# Patient Record
Sex: Female | Born: 1985 | Race: Black or African American | Hispanic: No | Smoking: Never smoker
Health system: Southern US, Community
[De-identification: ages and names within clinical notes are randomized; demographics above are authoritative.]

## PROBLEM LIST (undated history)

## (undated) ENCOUNTER — Inpatient Hospital Stay (HOSPITAL_COMMUNITY): Payer: Self-pay

## (undated) DIAGNOSIS — F32A Depression, unspecified: Secondary | ICD-10-CM

## (undated) DIAGNOSIS — Z8632 Personal history of gestational diabetes: Secondary | ICD-10-CM

## (undated) HISTORY — DX: Depression, unspecified: F32.A

## (undated) HISTORY — DX: Personal history of gestational diabetes: Z86.32

---

## 2019-11-23 ENCOUNTER — Ambulatory Visit (INDEPENDENT_AMBULATORY_CARE_PROVIDER_SITE_OTHER): Payer: Self-pay

## 2019-11-23 ENCOUNTER — Other Ambulatory Visit: Payer: Self-pay

## 2019-11-23 ENCOUNTER — Other Ambulatory Visit (HOSPITAL_COMMUNITY)
Admission: RE | Admit: 2019-11-23 | Discharge: 2019-11-23 | Disposition: A | Discharge status: Home / Self Care | Payer: Self-pay | Source: Ambulatory Visit

## 2019-11-23 DIAGNOSIS — Z3490 Encounter for supervision of normal pregnancy, unspecified, unspecified trimester: Secondary | ICD-10-CM | POA: Insufficient documentation

## 2019-11-23 DIAGNOSIS — Z3492 Encounter for supervision of normal pregnancy, unspecified, second trimester: Secondary | ICD-10-CM | POA: Insufficient documentation

## 2019-11-23 HISTORY — DX: Encounter for supervision of normal pregnancy, unspecified, unspecified trimester: Z34.90

## 2019-11-23 NOTE — Patient Instructions (Signed)
AREA PEDIATRIC/FAMILY PRACTICE PHYSICIANS  [place] ([address]) . [facility] o Chambliss, MD; Eniola, MD; Hale, MD; Hensel, MD; McDiarmid, MD; McIntyer, MD; Neal, MD; Walden, MD o [address] [place], [place] [address] o [phone] o Mon-Fri 8:30-12:30, 1:30-5:00 o Providers come to see babies at [facility] o Accepting Medicaid . [facility] o Limited providers who accept newborns: Koirala, MD; Morrow, MD; Wolters, MD o [address], [place], [place] [address] o [phone] o Mon-Fri 8:00-5:30 o Babies seen by providers at [facility] o Does NOT accept Medicaid o Please call early in hospitalization for appointment (limited availability)  . [facility] o Mulberry, MD o [address] [place], [place] [address] o [phone] o Mon, Tue, Thur, Fri 8:30-5:00, Wed 10:00-7:00 (closed 1-2pm) o Babies seen by [facility] providers o Accepting Medicaid . [facility] - Pediatrician o Rubin, MD o [address], [place], [place] [address] o [phone] o Mon-Fri 8:30-5:00, Sat 8:30-12:00 o Provider comes to see babies at [facility] o Accepting Medicaid o Must have been referred from current patients or contacted office prior to delivery . [facility] ([facility]) o Brown, MD; Chandler, MD; Ettefagh, MD; Grant, MD; Lester, MD; McCormick, MD; McQueen, MD; Prose, MD; Simha, MD; Stanley, MD; Stryffeler, NP; Tebben, NP o [address], [place], [place] [address] o [phone] o Mon, Tue, Thur, Fri 8:30-5:30, Wed 9:30-5:30, Sat 8:30-12:30 o Babies seen by [facility] providers o Accepting Medicaid o Only accepting infants of first-time parents or siblings of current patients o Hospital discharge coordinator will make follow-up appointment . Jack Amos o [address],  [place], [place]  [address] o [phone]   Fax - [phone] . [facility] o [address], [place], [place]  [address] o Phone - [phone]   Fax - [phone] . Shilpa Gosrani o [address], [place], [place]  [address] o [phone]  [place] ([address]) . [facility] o Bates, MD; Brassfield, MD; Cooper, Cox, MD; MD; Davis, MD; Dovico, MD; Ettefaugh, MD; Little, MD; Lowe, MD; Keiffer, MD; Melvin, MD; Sumner, MD; Williams, MD o [address], [place], [place] [address] o [phone] o Mon-Fri 8:30-5:00 (extended evenings Mon-Thur as needed), Sat-Sun 10:00-1:00 o Providers come to see babies at [facility] o Accepting Medicaid for families of first-time babies and families with all children in the household age 3 and under. Must register with office prior to making appointment (M-F only). . [facility] o Henson, NP; Knapp, MD; Lalonde, MD; Tysinger, PA o [address] [place], [place] [address] o [phone] o Mon-Fri 8:00-5:00 o Babies seen by providers at [facility] o Does NOT accept Medicaid/Commercial Insurance Only . [facility] ([facility])  o Artis, MD; Barnes, MD; Bratton, MD; Coccaro, MD; Lockett Gardner, MD; Kramer, MD; Marshall, MD; Netherton, MD; Poleto, MD; Skinner, MD o [address] [place], [place] [address] o [phone] o Mon-Fri 8:30-5:30, Sat (Oct.-Mar.) 9:00-1:00 o Babies seen by providers at [facility] o Accepting Medicaid  [place] ([address]) . [facility] o Reid, MD; Warner, MD o [address], [place], [place] [address] o [phone] o Mon-Fri 8:30-5:00, Sat 8:30-12:00 o Providers come to see babies at [facility] o Does NOT accept Medicaid . [facility] o Becker, PA; Hagler, MD; Scifres, PA; Sun, MD; Swayne, MD o [address],  [place], [place] [address] o [phone] o Mon-Fri 8:00-5:00 o Babies seen by providers at [facility] o Does NOT accept Medicaid o Only accepting babies of parents who are patients o Please call early in hospitalization for appointment (limited availability) . [facility] o Clark, MD; Frye, MD; Kelleher, MD; Mack, NP; Miller, MD; O'Keller, MD; Patterson, NP; Pudlo, MD; Puzio, MD; Thomas, MD; Tucker, MD; Twiselton, MD o [address], [place], [place] [address] o [phone] o Mon-Fri 8:00-5:00, Sat 9:00-12:00 o Providers come to see babies at [facility] o Does NOT accept Medicaid  [place] ([address]) . [facility] o Limited providers accepting new patients: Brake, NP; Wharton, PA o [address], [place], [place] [address] o [phone] o Mon-Fri 8:00-5:00 o Babies seen by providers at [facility] o Does NOT accept Medicaid o Only accepting babies of parents who are patients o Please call early in hospitalization for appointment (limited availability) . [facility] o Gay, MD; Quinlan, MD o [address] [place], [place] [address] o [phone] (press [phone] to schedule appointment) o Mon-Fri 8:00-5:00 o Providers come to see babies at [facility] o Does NOT accept Medicaid . [facility] o Mazer, MD o [address] [place], [place] [address] o [phone] o Mon-Fri 8:30-5:00 (lunch 12:30-1:00), extended hours by appointment only Wed 5:00-6:30 o Babies seen by [facility] providers o Accepting Medicaid . [facility] o Banks, MD; Jordan, MD; Koberlein, MD o [address], [place], [place] [address] o [phone] o Mon-Fri 8:00-5:00 o Babies seen by [facility] providers o Does NOT accept Medicaid . [facility] o Parker, MD; [place], MD; Wallace, DO o [address] [place], [place]  [address] o [phone] o Mon-Fri 8:00-5:00 o Babies seen by [facility] providers o Does NOT accept Medicaid . [facility] o Brandon, PA; Brecken, PA; Christy, NP; Dees, MD; DeClaire, MD; DeWeese, MD; Hansen, NP; Mills, NP; Parrish, NP; Smoot, NP; Summer, MD; Vapne, MD o [address] [place], [place] [address] o [phone] o Mon-Fri 8:30-5:00, Sat 10:00-1:00 o Providers come to see babies at [facility] o Does NOT accept Medicaid o Free prenatal information session Tuesdays at 4:45pm . [facility] o Bouska, MD; Gordon, PA; Jeffery, PA; Weber, PA o [address] [place] [address] o [phone] o Mon-Fri 7:30-5:30 o Babies seen by [facility] providers . [facility] o [address], [place], [place]  [address] o [phone]   Fax - [phone]  [place] ([address]) . [facility] o Reese, MD o [address] [place], [place] [address] o [phone] o Mon-Thur 8:00-6:00 o Providers come to see babies at [facility] o Accepting Medicaid . [facility] o Anderson, NP; Badger, MD; Beal, PA; Spencer, PA o [address] [place], [place] [address] o [phone] o Mon-Thur 7:30-7:30, Fri 7:30-4:30 o Babies seen by [facility] providers o Accepting Medicaid . [facility] o Agbuya, MD; Klett, NP; Romgoolam, MD o [address], [place], [place] [address] o [phone] o Mon-Fri 8:30-5:00, Sat 8:30-12:00 o Providers come to see babies at [facility] o Accepting Medicaid o Must have "Meet & Greet" appointment at office prior to delivery . [facility] [place] [facility] o McCord,   MD; Wallace, MD; Wood, MD o [address], [place], [place] [address] o [phone] o Mon-Wed 8:00-6:00, Thur-Fri 8:00-5:00, Sat 9:00-12:00 o Providers come to  see babies at [facility] o Does NOT accept Medicaid o Only accepting siblings of current patients . [facility]  o [address], [place], [place]  [address] o [phone]   Fax - [phone] . [facility] o [address], [place], [place]  [address] o [phone]   Fax - [phone]  [place] ([address] & [address]) . [facility] o Cirigliano, DO; Matthews, DO o [address] [place], [place] [address] o [phone] o Mon-Fri 7:00-5:00 o Babies seen by [facility] providers o Does NOT accept Medicaid . [facility] o Briscoe, MD; Howley, PA; Moreira, PA o [address], [place], [place] [address] o [phone] o Mon-Fri 8:00-5:00 o Babies seen by [facility] providers o Accepting Medicaid . [facility] o Boyd, MD; Church, PA; Jones, NP; Osborn, PA o [address], , [place] [address] o [phone] o Mon-Fri 8:00-5:00 o Babies seen by providers at [facility] o Accepting Medicaid  [place] ([address]) . [facility] [place] o Wendling, DO o [address] [place], [place] [address] o [phone] o Mon-Fri 8:00-5:00 o Babies seen by [facility] providers o Does NOT accept Medicaid o Limited availability, please call early in hospitalization to schedule follow-up . [facility] o Calderon, PA; Cummings, MD; Dillard, MD; Martin, PA; Olson, MD; VanDeven, PA o [address], [place], [place] [address] o [phone] o Mon-Fri 8:30-5:00, Sat 9:00-12:00 o Babies seen by providers at [facility] o Accepting Medicaid o Please register online then schedule online or call office o [email or web address] . [facility] ([facility] o [place], NP; Kumar, MD; Martin Rogers, PA o [address], [place], [place] [address] o [phone] o Mon-Fri 8:00-5:00 o Babies seen by providers at [facility] o Accepting Medicaid . [facility] ([facility] o [place], MD; Kristi Fleenor, NP; West, MD o [address], [place], [place] [address] o [phone] o Mon-Fri 8:00-5:30, Sat&Sun by appointment (phones open at 8:30) o Babies seen by [facility] providers o Accepting Medicaid o Must be a first-time baby or sibling of current patient . [facility] [place]  o [address], [place], [place]  [address] o [phone]   Fax - [phone]  [place] ([address]) . [facility] o Brown, PA; Cowen, PA; Rice, MD; Helton, PA; Spry, MD o [address] [place], [place] [address] o [phone] o Mon-Thur 8:00-7:00, Fri 8:00-5:00, Sat 8:00-12:00, Sun 9:00-12:00 o Babies seen by [facility] providers o Accepting Medicaid . [facility] [place] o Coe-Goins, MD; Marshall, MD; Pierre-Louis, MD o [address] [place] [address], [place], [place] [address] o [phone] o Mon-Thur 8:00-5:00 o Babies seen by providers at [facility] o Accepting Medicaid . [facility] o Bratton, MD; Coe-Goins, MD; Hayes, MD; Lewis, MD; List, MD; Lott, MD; Marshall, MD; Moran, MD; O'Neal, MD; Pierre-Louis, MD; Pitonzo, MD; Scholer, MD; Spangle, MD o [address] [place], [place]   [address] o [phone] o Mon-Fri 8:00-5:30, Sat (Oct.-Mar.) 9:00-1:00 o Babies seen by providers at [facility] o Accepting Medicaid o Must fill out new patient packet, available online at [email or web address] . [facility] ([facility] o Friddle, NP; Harris, NP; Kelly, NP; Logan, MD;  Melvin, PA; Poth, MD; Ramadoss, MD; Stanton, NP o [address], [place], [place] [address] o [phone] o Mon-Thur 8:00-5:30, Fri 8:00-5:00 o Babies seen by providers at [facility] o Accepting Medicaid  [place] ([address]) . [facility] o Dixon, PA; Holland, MD; Pickard, MD; Tapia, PA o [address], [place], [place] [address] o [phone] o Mon-Fri 8:00-5:00 o Babies seen by providers at [facility] o Accepting Medicaid   [place] ([address]) . [facility] [place] o Masneri, DO; Meyers, MD; Nelson, PA o [address], [place], [place] [address] o [phone] o Mon-Fri 8:00-5:00 o Babies seen by providers at [facility] o Does NOT accept Medicaid o Limited appointment availability, please call early in hospitalization  . [facility] [place] o Kunedd, DO; McGowen, MD o [address], [place], [place] [address] o [phone] o Mon-Fri 8:00-5:00 o Babies seen by [facility] providers o Does NOT accept Medicaid . [facility] [place] o Cameron, MD; MacDonald, MD; Michaels, PA; Nayak, MD o [address] [place] [address], [place], [place] [address] o [phone] o Mon-Fri 8:00-5:00 o After hours clinic ([address] [place], [place] [address]) [phone] Mon-Fri 5:00-8:00, Sat 12:00-6:00, Sun 10:00-4:00 o Babies seen by [facility] providers o Accepting Medicaid . [facility] [place] o [address], [place], [place]  [address] o [phone]   Fax - [phone]  [place] ([address]) . [facility] [place] [facility] o Andy, MD o [address], [place], [place] [address] o [phone] o Mon-Fri 8:00-5:00 o Babies seen by [facility] providers o Does NOT accept Medicaid . [facility] [place] ([facility] [place]) o Eksir, MD o [address], [place], [place]  [address] o [phone] o Mon-Thur 8:00-7:00, Fri 8:00-5:00, Sat 8:00-12:00 o Babies seen by providers at [facility] o Accepting Medicaid - but does not have vaccinations in office (must be received elsewhere) o Limited availability, please call early in hospitalization  [place] ([address]) . [facility]  o Charlene Flemming, MD o [address], [place]  [address] o [phone]  Fax [phone]   

## 2019-11-23 NOTE — Progress Notes (Signed)
History:   Katherine Holder is a 34 y.o. G3P1011 at [redacted]w[redacted]d by LMP being seen today for her first obstetrical visit.  Her obstetrical history is significant for obesity. Patient does intend to breast feed. Pregnancy history fully reviewed.  Patient reports no complaints.    HISTORY: OB History  Gravida Para Term Preterm AB Living  3 1 1  0 1 1  SAB TAB Ectopic Multiple Live Births  1 0 0 0 1    # Outcome Date GA Lbr Len/2nd Weight Sex Delivery Anes PTL Lv  3 Current           2 SAB 04/2018          1 Term 03/31/16 [redacted]w[redacted]d   F Vag-Spont None  LIV    Last pap smear was done 2018 and was normal per patient; plan to do PP  Past Medical History:  Diagnosis Date  . Depression    History reviewed. No pertinent surgical history. Family History  Problem Relation Age of Onset  . Diabetes Mother    Social History   Tobacco Use  . Smoking status: Not on file  Vaping Use  . Vaping Use: Never used  Substance Use Topics  . Alcohol use: Not on file  . Drug use: Not on file   Not on File Current Outpatient Medications on File Prior to Visit  Medication Sig Dispense Refill  . Prenatal Vit-Fe Fumarate-FA (MULTIVITAMIN-PRENATAL) 27-0.8 MG TABS tablet Take 1 tablet by mouth daily at 12 noon.    . thiamine (VITAMIN B-1) 50 MG tablet Take 50 mg by mouth daily.     No current facility-administered medications on file prior to visit.    Review of Systems Pertinent items noted in HPI and remainder of comprehensive ROS otherwise negative. Physical Exam:   Vitals:   11/23/19 1508 11/23/19 1518  BP: 125/80   Pulse: 76   Weight: 215 lb (97.5 kg)   Height:  5\' 6"  (1.676 m)   Fetal Heart Rate (bpm): 146  Uterus:     Pelvic Exam: Perineum: deferred   Vulva: deferred   Vagina:  deferred   Cervix: deferred   Adnexa: deferred   Bony Pelvis: adequate  System: General: well-developed, well-nourished female in no acute distress   Breasts:  Deferred    Skin: normal coloration and turgor, no  rashes   Neurologic: oriented, normal, negative, normal mood   Extremities: normal strength, tone, and muscle mass, ROM of all joints is normal   HEENT PERRLA, extraocular movement intact and sclera clear, anicteric   Mouth/Teeth mucous membranes moist   Neck supple and no masses   Cardiovascular: regular rate and rhythm   Respiratory:  no respiratory distress, normal breath sounds   Abdomen: soft, non-tender; bowel sounds normal; no masses,  no organomegaly    Assessment:    Pregnancy: G3P1011 Patient Active Problem List   Diagnosis Date Noted  . Supervision of low-risk pregnancy 11/23/2019     Plan:    1. Encounter for supervision of low-risk pregnancy in second trimester - Pt doing well. No complaints. - Congratulations given as this is a planned pregnancy! - Anticipatory guidance for upcoming appointments - Discussed ACOG recommendations for TWG of 11-20lbs given BMI >30; discussed diet and exercise - NOB labs today - Pt given BP cuff and signed up for babyscripts   - Culture, OB Urine - GC/Chlamydia probe amp ([place])not at [facility] - CBC/D/Plt+RPR+Rh+ABO+Rub Ab... - CHL AMB BABYSCRIPTS SCHEDULE OPTIMIZATION  Initial labs drawn. Continue prenatal vitamins. Problem list reviewed and updated. Ultrasound discussed; fetal anatomic survey: ordered. Anticipatory guidance about prenatal visits given including labs, ultrasounds, and testing. Discussed usage of Babyscripts and virtual visits as additional source of managing and completing prenatal visits in midst of coronavirus and pandemic.   Encouraged to complete MyChart Registration for her ability to review results, send requests, and have questions addressed.  The nature of  - Center for [facility] Healthcare/Faculty Practice with multiple MDs and Advanced Practice Providers was explained to patient; also emphasized that residents, students are part of our team. Routine obstetric precautions reviewed.  Encouraged to seek out care at office or emergency room [facility] preferred) for urgent and/or emergent concerns. Return in about 4 weeks (around 12/21/2019) for rob.    Camelia Eng, CNM

## 2019-11-24 LAB — CBC/D/PLT+RPR+RH+ABO+RUB AB...
Antibody Screen: NEGATIVE
Basophils Absolute: 0 10*3/uL (ref 0.0–0.2)
Basos: 0 %
EOS (ABSOLUTE): 0.1 10*3/uL (ref 0.0–0.4)
Eos: 2 %
HCV Ab: <0.1 s/co ratio (ref 0.0–0.9)
HIV Screen 4th Generation wRfx: NONREACTIVE
Hematocrit: 34.3 % (ref 34.0–46.6)
Hemoglobin: 11.7 g/dL (ref 11.1–15.9)
Hepatitis B Surface Ag: NEGATIVE
Immature Grans (Abs): 0 10*3/uL (ref 0.0–0.1)
Immature Granulocytes: 0 %
Lymphocytes Absolute: 2.8 10*3/uL (ref 0.7–3.1)
Lymphs: 37 %
MCH: 29 pg (ref 26.6–33.0)
MCHC: 34.1 g/dL (ref 31.5–35.7)
MCV: 85 fL (ref 79–97)
Monocytes Absolute: 0.8 10*3/uL (ref 0.1–0.9)
Monocytes: 10 %
Neutrophils Absolute: 3.9 10*3/uL (ref 1.4–7.0)
Neutrophils: 51 %
Platelets: 274 10*3/uL (ref 150–450)
RBC: 4.04 x10E6/uL (ref 3.77–5.28)
RDW: 13.3 % (ref 11.7–15.4)
RPR Ser Ql: NONREACTIVE
Rh Factor: POSITIVE
Rubella Antibodies, IGG: 6.1 index (ref >0.99)
WBC: 7.5 10*3/uL (ref 3.4–10.8)

## 2019-11-24 LAB — GC/CHLAMYDIA PROBE AMP (~~LOC~~) NOT AT ARMC
Chlamydia: NEGATIVE
Comment: NEGATIVE
Comment: NORMAL
Neisseria Gonorrhea: NEGATIVE

## 2019-11-24 LAB — HCV INTERPRETATION

## 2019-11-25 LAB — URINE CULTURE, OB REFLEX

## 2019-11-25 LAB — CULTURE, OB URINE

## 2019-11-28 ENCOUNTER — Encounter: Payer: Self-pay | Admitting: General Practice

## 2019-12-07 ENCOUNTER — Encounter: Payer: Self-pay | Admitting: *Deleted

## 2019-12-21 ENCOUNTER — Other Ambulatory Visit: Payer: Self-pay

## 2019-12-21 ENCOUNTER — Encounter: Payer: Self-pay | Admitting: Nurse Practitioner

## 2019-12-21 ENCOUNTER — Ambulatory Visit (INDEPENDENT_AMBULATORY_CARE_PROVIDER_SITE_OTHER): Payer: Self-pay | Admitting: Nurse Practitioner

## 2019-12-21 VITALS — BP 119/62 | HR 86 | Wt 217.3 lb

## 2019-12-21 DIAGNOSIS — O09292 Supervision of pregnancy with other poor reproductive or obstetric history, second trimester: Secondary | ICD-10-CM | POA: Insufficient documentation

## 2019-12-21 DIAGNOSIS — Z8632 Personal history of gestational diabetes: Secondary | ICD-10-CM

## 2019-12-21 DIAGNOSIS — Z3492 Encounter for supervision of normal pregnancy, unspecified, second trimester: Secondary | ICD-10-CM

## 2019-12-21 HISTORY — DX: Supervision of pregnancy with other poor reproductive or obstetric history, second trimester: O09.292

## 2019-12-21 NOTE — Progress Notes (Signed)
[facility] [place] scheduled 12/28/19 @ 1pm.

## 2019-12-21 NOTE — Progress Notes (Signed)
    Subjective:  Katherine Holder is a 34 y.o. G3P1011 at [redacted]w[redacted]d being seen today for ongoing prenatal care.  She is currently monitored for the following issues for this low-risk pregnancy and has Supervision of low-risk pregnancy and Hx of gestational diabetes in prior pregnancy, currently pregnant, second trimester on their problem list. Client is an adopt a mom.  Patient reports no complaints.  Contractions: Not present. Vag. Bleeding: None.  Movement: Present. Denies leaking of fluid.   The following portions of the patient's history were reviewed and updated as appropriate: allergies, current medications, past family history, past medical history, past social history, past surgical history and problem list. Problem list updated.  Objective:   Vitals:   12/21/19 1422  BP: 119/62  Pulse: 86  Weight: 217 lb 4.8 oz (98.6 kg)    Fetal Status: Fetal Heart Rate (bpm): 146 Fundal Height: 22 cm Movement: Present     General:  Alert, oriented and cooperative. Patient is in no acute distress.  Skin: Skin is warm and dry. No rash noted.   Cardiovascular: Normal heart rate noted  Respiratory: Normal respiratory effort, no problems with respiration noted  Abdomen: Soft, gravid, appropriate for gestational age. Pain/Pressure: Absent     Pelvic:  Cervical exam deferred        Extremities: Normal range of motion.  Edema: None  Mental Status: Normal mood and affect. Normal behavior. Normal judgment and thought content.   Urinalysis:      Assessment and Plan:  Pregnancy: G3P1011 at [redacted]w[redacted]d  1. Encounter for supervision of low-risk pregnancy in second trimester Reviewed [place] in Media tab from [facility].  Reviewed findings - was done for dating purposes (anatomy not done) Hx of irregular cycles so need to use [place] for dating.  Best EDC changed to [place] date. Will schedule again for anatomy at [facility] due to lesser out of pocket cost for client. Plans natural birth - did not use epidural for her first  pregnancy and plans for that again.  2. Hx of gestational diabetes in prior pregnancy, currently pregnant, second trimester Had gestational diabetes with her first pregnancy  Delivered in [place]  Preterm labor symptoms and general obstetric precautions including but not limited to vaginal bleeding, contractions, leaking of fluid and fetal movement were reviewed in detail with the patient. Please refer to After Visit Summary for other counseling recommendations.  Return in about 4 weeks (around 01/18/2020) for in person ROB.  Nolene Bernheim, RN, MSN, NP-BC Nurse Practitioner, [facility] for [employer], [facility] 12/21/2019 2:50 PM

## 2019-12-28 ENCOUNTER — Telehealth: Payer: Self-pay | Admitting: Lactation Services

## 2019-12-28 NOTE — Telephone Encounter (Signed)
Called patient in regards to when her symptoms started. She reports they started Tuesday afternoon. Currently she only has diarrhea as a symptom.   Informed her I would reach out the the department for infusion for Monoclonal antibodies to see if she qualifies for an infusion and they should be calling her. Patient voiced understanding.   email sent to [email or web address] with patient information.

## 2019-12-30 ENCOUNTER — Telehealth (HOSPITAL_COMMUNITY): Payer: Self-pay

## 2019-12-30 NOTE — Telephone Encounter (Signed)
Called to Discuss with patient about Covid symptoms and the use of the monoclonal antibody infusion for those with mild to moderate Covid symptoms and at a high risk of hospitalization.     Pt appears to qualify for this infusion due to co-morbid conditions and/or a member of an at-risk group in accordance with the FDA Emergency Use Authorization.    Pt stated her symptoms started on Tuesday 12/7, pt stated she had diarrhea, mild congestion. She states today she feels great, is in her 2nd trimester. After discussing treatment, possible side effects, patient has decided to decline treatment at this time. She stated she will call back if feeling worse to make an appointment. Hotline provided [phone].

## 2020-01-18 ENCOUNTER — Telehealth: Payer: Self-pay | Admitting: Family Medicine

## 2020-01-18 ENCOUNTER — Encounter: Payer: Self-pay | Admitting: Family Medicine

## 2020-01-18 NOTE — Telephone Encounter (Signed)
Returned patients call. She had an [place] appointment on 12/9 and was not able to go because of Covid. Informed I will call and reschedule.   She reports she has insurance that starts on 1/1 and would prefer to have done through [facility]. She will bring insurance card on 1/2 to appt. Will need [place] ordered and scheduled.

## 2020-01-18 NOTE — Telephone Encounter (Signed)
Patient need another order to get an [place]

## 2020-01-20 NOTE — L&D Delivery Note (Signed)
OB/GYN Faculty Practice Delivery Note  Katherine Holder is a 35 y.o. G3P1011 s/p SVD at [redacted]w[redacted]d. She was admitted for labor.   ROM: 3h 69m with moderate meconium fluid GBS Status:  Negative/-- (03/31 1346) Maximum Maternal Temperature: 97.9  Labor Progress: . Initial SVE: 8 cm. She was AROM'd. She then progressed to complete.   Delivery Date/Time: 03:08 on 4/27 Delivery: Called to room and patient was complete and pushing. Head delivered LOA. Single loose nuchal cord present and reduced. Shoulder and body delivered in usual fashion. Infant with spontaneous cry, placed on mother's abdomen, dried and stimulated. Cord clamped x 2 after 1-minute delay, and cut by mother. Cord blood drawn. Placenta delivered spontaneously with gentle cord traction. Fundus firm with massage and Pitocin. Labia, perineum, vagina, and cervix inspected inspected with second degree perineal laceration, repaired.  Baby Weight: pending  Placenta: Sent to L&D Complications: None Lacerations: second degree EBL: 300 mL Analgesia: Epidural   Infant:  APGAR (1 MIN): 8   APGAR (5 MINS): 9   APGAR (10 MINS):     Casper Harrison, MD [facility] Family Medicine Fellow, [facility] for [facility], [facility] 05/15/2020, 3:38 AM

## 2020-01-23 ENCOUNTER — Ambulatory Visit (INDEPENDENT_AMBULATORY_CARE_PROVIDER_SITE_OTHER): Payer: 59 | Admitting: Advanced Practice Midwife

## 2020-01-23 ENCOUNTER — Other Ambulatory Visit: Payer: Self-pay

## 2020-01-23 VITALS — BP 116/69 | HR 94 | Wt 214.6 lb

## 2020-01-23 DIAGNOSIS — Z8632 Personal history of gestational diabetes: Secondary | ICD-10-CM

## 2020-01-23 DIAGNOSIS — O09292 Supervision of pregnancy with other poor reproductive or obstetric history, second trimester: Secondary | ICD-10-CM

## 2020-01-23 DIAGNOSIS — Z3492 Encounter for supervision of normal pregnancy, unspecified, second trimester: Secondary | ICD-10-CM

## 2020-01-23 DIAGNOSIS — Z3A24 24 weeks gestation of pregnancy: Secondary | ICD-10-CM

## 2020-01-23 NOTE — Progress Notes (Signed)
   PRENATAL VISIT NOTE  Subjective:  Katherine Holder is a 35 y.o. G3P1011 at [redacted]w[redacted]d being seen today for ongoing prenatal care.  She is currently monitored for the following issues for this low-risk pregnancy and has Supervision of low-risk pregnancy and Hx of gestational diabetes in prior pregnancy, currently pregnant, second trimester on their problem list.  Patient reports no complaints.  Contractions: Not present. Vag. Bleeding: None.  Movement: Present. Denies leaking of fluid.   The following portions of the patient's history were reviewed and updated as appropriate: allergies, current medications, past family history, past medical history, past social history, past surgical history and problem list. Problem list updated.  Objective:   Vitals:   01/23/20 1439  BP: 116/69  Pulse: 94  Weight: 214 lb 9.6 oz (97.3 kg)    Fetal Status: Fetal Heart Rate (bpm): 150 Fundal Height: 26 cm Movement: Present     General:  Alert, oriented and cooperative. Patient is in no acute distress.  Skin: Skin is warm and dry. No rash noted.   Cardiovascular: Normal heart rate noted  Respiratory: Normal respiratory effort, no problems with respiration noted  Abdomen: Soft, gravid, appropriate for gestational age.  Pain/Pressure: Absent     Pelvic: Cervical exam deferred        Extremities: Normal range of motion.  Edema: None  Mental Status: Normal mood and affect. Normal behavior. Normal judgment and thought content.   Assessment and Plan:  Pregnancy: G3P1011 at [redacted]w[redacted]d  1. Encounter for supervision of low-risk pregnancy in second trimester - LOB, routine care - Preemptive teaching 2 hour GTT next visit - [place] MFM OB COMP + 14 WK; Future  2. Hx of gestational diabetes in prior pregnancy, currently pregnant, second trimester   3. [redacted] weeks gestation of pregnancy   Preterm labor symptoms and general obstetric precautions including but not limited to vaginal bleeding, contractions, leaking of fluid  and fetal movement were reviewed in detail with the patient. Please refer to After Visit Summary for other counseling recommendations.  Return in about 4 weeks (around 02/20/2020) for fasting GTT, female providers only please.  New insurance, needs to schedule fetal anatomy ultrasound ASAP  Future Appointments  Date Time Provider Department Center  02/20/2020  8:50 AM [facility]  02/20/2020  9:15 AM Burleson, Brand Males, NP [facility]    Calvert Cantor, CNM

## 2020-01-23 NOTE — Patient Instructions (Addendum)
Glucose Tolerance Test During Pregnancy Why am I having this test? The glucose tolerance test (GTT) is done to check how your body processes sugar (glucose). This is one of several tests used to diagnose diabetes that develops during pregnancy (gestational diabetes mellitus). Gestational diabetes is a temporary form of diabetes that some women develop during pregnancy. It usually occurs during the second trimester of pregnancy and goes away after delivery. Testing (screening) for gestational diabetes usually occurs between 24 and 28 weeks of pregnancy. You may have the GTT test after having a 1-hour glucose screening test if the results from that test indicate that you may have gestational diabetes. You may also have this test if:  You have a history of gestational diabetes.  You have a history of giving birth to very large babies or have experienced repeated fetal loss (stillbirth).  You have signs and symptoms of diabetes, such as: ? Changes in your vision. ? Tingling or numbness in your hands or feet. ? Changes in hunger, thirst, and urination that are not otherwise explained by your pregnancy. What is being tested? This test measures the amount of glucose in your blood at different times during a period of 3 hours. This indicates how well your body is able to process glucose. What kind of sample is taken?  Blood samples are required for this test. They are usually collected by inserting a needle into a blood vessel. How do I prepare for this test?  For 3 days before your test, eat normally. Have plenty of carbohydrate-rich foods.  Follow instructions from your health care provider about: ? Eating or drinking restrictions on the day of the test. You may be asked to not eat or drink anything other than water (fast) starting 8-10 hours before the test. ? Changing or stopping your regular medicines. Some medicines may interfere with this test. Tell a health care provider about:  All  medicines you are taking, including vitamins, herbs, eye drops, creams, and over-the-counter medicines.  Any blood disorders you have.  Any surgeries you have had.  Any medical conditions you have. What happens during the test? First, your blood glucose will be measured. This is referred to as your fasting blood glucose, since you fasted before the test. Then, you will drink a glucose solution that contains a certain amount of glucose. Your blood glucose will be measured again 1, 2, and 3 hours after drinking the solution. This test takes about 3 hours to complete. You will need to stay at the testing location during this time. During the testing period:  Do not eat or drink anything other than the glucose solution.  Do not exercise.  Do not use any products that contain nicotine or tobacco, such as cigarettes and e-cigarettes. If you need help stopping, ask your health care provider. The testing procedure may vary among health care providers and hospitals. How are the results reported? Your results will be reported as milligrams of glucose per deciliter of blood (mg/dL) or millimoles per liter (mmol/L). Your health care provider will compare your results to normal ranges that were established after testing a large group of people (reference ranges). Reference ranges may vary among labs and hospitals. For this test, common reference ranges are:  Fasting: less than 95-105 mg/dL (5.3-5.8 mmol/L).  1 hour after drinking glucose: less than 180-190 mg/dL (10.0-10.5 mmol/L).  2 hours after drinking glucose: less than 155-165 mg/dL (8.6-9.2 mmol/L).  3 hours after drinking glucose: 140-145 mg/dL (7.8-8.1 mmol/L). What do the   results mean? Results within reference ranges are considered normal, meaning that your glucose levels are well-controlled. If two or more of your blood glucose levels are high, you may be diagnosed with gestational diabetes. If only one level is high, your health care  provider may suggest repeat testing or other tests to confirm a diagnosis. Talk with your health care provider about what your results mean. Questions to ask your health care provider Ask your health care provider, or the department that is doing the test:  When will my results be ready?  How will I get my results?  What are my treatment options?  What other tests do I need?  What are my next steps? Summary  The glucose tolerance test (GTT) is one of several tests used to diagnose diabetes that develops during pregnancy (gestational diabetes mellitus). Gestational diabetes is a temporary form of diabetes that some women develop during pregnancy.  You may have the GTT test after having a 1-hour glucose screening test if the results from that test indicate that you may have gestational diabetes. You may also have this test if you have any symptoms or risk factors for gestational diabetes.  Talk with your health care provider about what your results mean. This information is not intended to replace advice given to you by your health care provider. Make sure you discuss any questions you have with your health care provider. Document Revised: 04/28/2018 Document Reviewed: 08/17/2016 Elsevier Patient Education  2020 [employer].   Food Choices for Gastroesophageal Reflux Disease, Adult When you have gastroesophageal reflux disease (GERD), the foods you eat and your eating habits are very important. Choosing the right foods can help ease your discomfort. Think about working with a nutrition specialist (dietitian) to help you make good choices. What are tips for following this plan?  Meals  Choose healthy foods that are low in fat, such as fruits, vegetables, whole grains, low-fat dairy products, and lean meat, fish, and poultry.  Eat small meals often instead of 3 large meals a day. Eat your meals slowly, and in a place where you are relaxed. Avoid bending over or lying down until 2-3  hours after eating.  Avoid eating meals 2-3 hours before bed.  Avoid drinking a lot of liquid with meals.  Cook foods using methods other than frying. Bake, grill, or broil food instead.  Avoid or limit: ? Chocolate. ? Peppermint or spearmint. ? Alcohol. ? Pepper. ? Black and decaffeinated coffee. ? Black and decaffeinated tea. ? Bubbly (carbonated) soft drinks. ? Caffeinated energy drinks and soft drinks.  Limit high-fat foods such as: ? Fatty meat or fried foods. ? Whole milk, cream, butter, or ice cream. ? Nuts and nut butters. ? Pastries, donuts, and sweets made with butter or shortening.  Avoid foods that cause symptoms. These foods may be different for everyone. Common foods that cause symptoms include: ? Tomatoes. ? Oranges, lemons, and limes. ? Peppers. ? Spicy food. ? Onions and garlic. ? Vinegar. Lifestyle  Maintain a healthy weight. Ask your doctor what weight is healthy for you. If you need to lose weight, work with your doctor to do so safely.  Exercise for at least 30 minutes for 5 or more days each week, or as told by your doctor.  Wear loose-fitting clothes.  Do not smoke. If you need help quitting, ask your doctor.  Sleep with the head of your bed higher than your feet. Use a wedge under the mattress or blocks under the  bed frame to raise the head of the bed. Summary  When you have gastroesophageal reflux disease (GERD), food and lifestyle choices are very important in easing your symptoms.  Eat small meals often instead of 3 large meals a day. Eat your meals slowly, and in a place where you are relaxed.  Limit high-fat foods such as fatty meat or fried foods.  Avoid bending over or lying down until 2-3 hours after eating.  Avoid peppermint and spearmint, caffeine, alcohol, and chocolate. This information is not intended to replace advice given to you by your health care provider. Make sure you discuss any questions you have with your health care  provider. Document Revised: 04/28/2018 Document Reviewed: 02/11/2016 Elsevier Patient Education  2020 [employer].

## 2020-02-07 ENCOUNTER — Ambulatory Visit: Payer: 59

## 2020-02-08 ENCOUNTER — Other Ambulatory Visit: Payer: Self-pay | Admitting: *Deleted

## 2020-02-08 ENCOUNTER — Other Ambulatory Visit: Payer: Self-pay

## 2020-02-08 ENCOUNTER — Ambulatory Visit: Payer: 59 | Attending: Advanced Practice Midwife

## 2020-02-08 DIAGNOSIS — Z3492 Encounter for supervision of normal pregnancy, unspecified, second trimester: Secondary | ICD-10-CM | POA: Diagnosis present

## 2020-02-08 DIAGNOSIS — Z362 Encounter for other antenatal screening follow-up: Secondary | ICD-10-CM

## 2020-02-19 ENCOUNTER — Other Ambulatory Visit: Payer: Self-pay | Admitting: *Deleted

## 2020-02-19 DIAGNOSIS — Z3490 Encounter for supervision of normal pregnancy, unspecified, unspecified trimester: Secondary | ICD-10-CM

## 2020-02-20 ENCOUNTER — Ambulatory Visit (INDEPENDENT_AMBULATORY_CARE_PROVIDER_SITE_OTHER): Payer: 59 | Admitting: Nurse Practitioner

## 2020-02-20 ENCOUNTER — Other Ambulatory Visit: Payer: 59

## 2020-02-20 ENCOUNTER — Other Ambulatory Visit: Payer: Self-pay

## 2020-02-20 VITALS — BP 90/66 | HR 112 | Wt 217.0 lb

## 2020-02-20 DIAGNOSIS — Z3490 Encounter for supervision of normal pregnancy, unspecified, unspecified trimester: Secondary | ICD-10-CM

## 2020-02-20 DIAGNOSIS — Z8632 Personal history of gestational diabetes: Secondary | ICD-10-CM

## 2020-02-20 DIAGNOSIS — O09292 Supervision of pregnancy with other poor reproductive or obstetric history, second trimester: Secondary | ICD-10-CM

## 2020-02-20 MED ORDER — PRENATAL VITAMINS 28-0.8 MG PO TABS: 1.0000 | ORAL_TABLET | Freq: Every day | ORAL | 11 refills | Status: DC

## 2020-02-20 NOTE — Progress Notes (Signed)
    Subjective:  Katherine Holder is a 35 y.o. G3P1011 at [redacted]w[redacted]d being seen today for ongoing prenatal care.  She is currently monitored for the following issues for this low-risk pregnancy and has Supervision of low-risk pregnancy and Hx of gestational diabetes in prior pregnancy, currently pregnant, second trimester on their problem list.  Patient reports no complaints.  Contractions: Not present. Vag. Bleeding: None.  Movement: Present. Denies leaking of fluid.   The following portions of the patient's history were reviewed and updated as appropriate: allergies, current medications, past family history, past medical history, past social history, past surgical history and problem list. Problem list updated.  Objective:   Vitals:   02/20/20 0956  BP: 90/66  Pulse: (!) 112  Weight: 217 lb (98.4 kg)    Fetal Status: Fetal Heart Rate (bpm): 145 Fundal Height: 32 cm Movement: Present     General:  Alert, oriented and cooperative. Patient is in no acute distress.  Skin: Skin is warm and dry. No rash noted.   Cardiovascular: Normal heart rate noted  Respiratory: Normal respiratory effort, no problems with respiration noted  Abdomen: Soft, gravid, appropriate for gestational age. Pain/Pressure: Present     Pelvic:  Cervical exam deferred        Extremities: Normal range of motion.  Edema: None  Mental Status: Normal mood and affect. Normal behavior. Normal judgment and thought content.   Urinalysis:      Assessment and Plan:  Pregnancy: G3P1011 at [redacted]w[redacted]d  1. Encounter for supervision of low-risk pregnancy, antepartum Did not get TDAP today but may consider it later - discussed the protection for the baby from whooping cough and hospitalization if they get whooping cough.  Advised it is recommended in each pregnancy - did not get it in the previous pregnancy.  - Prenatal Vit-Fe Fumarate-FA (PRENATAL VITAMINS) 28-0.8 MG TABS; Take 1 tablet by mouth daily.  Dispense: 30 tablet; Refill: 11  2.  Hx of gestational diabetes in prior pregnancy, currently pregnant, second trimester glucola done today  3.  Reviewed getting Covid booster  OK to have in pregnancy Does not need to wait to get the booster as she now has no symptoms from Covid in December 2021  Preterm labor symptoms and general obstetric precautions including but not limited to vaginal bleeding, contractions, leaking of fluid and fetal movement were reviewed in detail with the patient. Please refer to After Visit Summary for other counseling recommendations.  Return in about 2 weeks (around 03/05/2020) for in person ROB.  Nolene Bernheim, RN, MSN, NP-BC Nurse Practitioner, [facility] for [employer], [facility] 02/20/2020 10:32 AM

## 2020-02-20 NOTE — Patient Instructions (Signed)
Tdap (Tetanus, Diphtheria, Pertussis) Vaccine: What You Need to Know 1. Why get vaccinated? Tdap vaccine can prevent tetanus, diphtheria, and pertussis. Diphtheria and pertussis spread from person to person. Tetanus enters the body through cuts or wounds.  TETANUS (T) causes painful stiffening of the muscles. Tetanus can lead to serious health problems, including being unable to open the mouth, having trouble swallowing and breathing, or death.  DIPHTHERIA (D) can lead to difficulty breathing, heart failure, paralysis, or death.  PERTUSSIS (aP), also known as "whooping cough," can cause uncontrollable, violent coughing that makes it hard to breathe, eat, or drink. Pertussis can be extremely serious especially in babies and young children, causing pneumonia, convulsions, brain damage, or death. In teens and adults, it can cause weight loss, loss of bladder control, passing out, and rib fractures from severe coughing. 2. Tdap vaccine Tdap is only for children 7 years and older, adolescents, and adults.  Adolescents should receive a single dose of Tdap, preferably at age 11 or 12 years. Pregnant people should get a dose of Tdap during every pregnancy, preferably during the early part of the third trimester, to help protect the newborn from pertussis. Infants are most at risk for severe, life-threatening complications from pertussis. Adults who have never received Tdap should get a dose of Tdap. Also, adults should receive a booster dose of either Tdap or Td (a different vaccine that protects against tetanus and diphtheria but not pertussis) every 10 years, or after 5 years in the case of a severe or dirty wound or burn. Tdap may be given at the same time as other vaccines. 3. Talk with your health care provider Tell your vaccine provider if the person getting the vaccine:  Has had an allergic reaction after a previous dose of any vaccine that protects against tetanus, diphtheria, or pertussis, or  has any severe, life-threatening allergies  Has had a coma, decreased level of consciousness, or prolonged seizures within 7 days after a previous dose of any pertussis vaccine (DTP, DTaP, or Tdap)  Has seizures or another nervous system problem  Has ever had Guillain-Barr Syndrome (also called "GBS")  Has had severe pain or swelling after a previous dose of any vaccine that protects against tetanus or diphtheria In some cases, your health care provider may decide to postpone Tdap vaccination until a future visit. People with minor illnesses, such as a cold, may be vaccinated. People who are moderately or severely ill should usually wait until they recover before getting Tdap vaccine.  Your health care provider can give you more information. 4. Risks of a vaccine reaction  Pain, redness, or swelling where the shot was given, mild fever, headache, feeling tired, and nausea, vomiting, diarrhea, or stomachache sometimes happen after Tdap vaccination. People sometimes faint after medical procedures, including vaccination. Tell your provider if you feel dizzy or have vision changes or ringing in the ears.  As with any medicine, there is a very remote chance of a vaccine causing a severe allergic reaction, other serious injury, or death. 5. What if there is a serious problem? An allergic reaction could occur after the vaccinated person leaves the clinic. If you see signs of a severe allergic reaction (hives, swelling of the face and throat, difficulty breathing, a fast heartbeat, dizziness, or weakness), call 9-1-1 and get the person to the nearest hospital. For other signs that concern you, call your health care provider.  Adverse reactions should be reported to the Vaccine Adverse Event Reporting System (VAERS). Your health   care provider will usually file this report, or you can do it yourself. Visit the VAERS website at www.vaers.hhs.gov or call 1-800-822-7967. VAERS is only for reporting  reactions, and VAERS staff members do not give medical advice. 6. The National Vaccine Injury Compensation Program The National Vaccine Injury Compensation Program (VICP) is a federal program that was created to compensate people who may have been injured by certain vaccines. Claims regarding alleged injury or death due to vaccination have a time limit for filing, which may be as short as two years. Visit the VICP website at www.hrsa.gov/vaccinecompensation or call 1-800-338-2382 to learn about the program and about filing a claim. 7. How can I learn more?  Ask your health care provider.  Call your local or state health department.  Visit the website of the Food and Drug Administration (FDA) for vaccine package inserts and additional information at www.fda.gov/vaccines-blood-biologics/vaccines.  Contact the Centers for Disease Control and Prevention (CDC): ? Call 1-800-232-4636 (1-800-CDC-INFO) or ? Visit CDC's website at www.cdc.gov/vaccines. Vaccine Information Statement Tdap (Tetanus, Diphtheria, Pertussis) Vaccine (08/25/2019) This information is not intended to replace advice given to you by your health care provider. Make sure you discuss any questions you have with your health care provider. Document Revised: 09/20/2019 Document Reviewed: 09/20/2019 Elsevier Patient Education  2021 [employer] Inc.  

## 2020-02-20 NOTE — Progress Notes (Signed)
Requested PNV RX, Given. Declines tdap.  Krystina Strieter,RN

## 2020-02-21 LAB — CBC
Hematocrit: 35.3 % (ref 34.0–46.6)
Hemoglobin: 12.1 g/dL (ref 11.1–15.9)
MCH: 28.9 pg (ref 26.6–33.0)
MCHC: 34.3 g/dL (ref 31.5–35.7)
MCV: 84 fL (ref 79–97)
Platelets: 248 10*3/uL (ref 150–450)
RBC: 4.18 x10E6/uL (ref 3.77–5.28)
RDW: 12.3 % (ref 11.7–15.4)
WBC: 7.9 10*3/uL (ref 3.4–10.8)

## 2020-02-21 LAB — GLUCOSE TOLERANCE, 2 HOURS W/ 1HR
Glucose, 1 hour: 137 mg/dL (ref 65–179)
Glucose, 2 hour: 125 mg/dL (ref 65–152)
Glucose, Fasting: 85 mg/dL (ref 65–91)

## 2020-02-21 LAB — HIV ANTIBODY (ROUTINE TESTING W REFLEX): HIV Screen 4th Generation wRfx: NONREACTIVE

## 2020-02-21 LAB — RPR: RPR Ser Ql: NONREACTIVE

## 2020-03-05 ENCOUNTER — Encounter: Payer: 59 | Admitting: Certified Nurse Midwife

## 2020-03-06 ENCOUNTER — Other Ambulatory Visit: Payer: Self-pay

## 2020-03-06 ENCOUNTER — Encounter: Payer: Self-pay | Admitting: Lactation Services

## 2020-03-06 ENCOUNTER — Ambulatory Visit (INDEPENDENT_AMBULATORY_CARE_PROVIDER_SITE_OTHER): Payer: 59 | Admitting: Nurse Practitioner

## 2020-03-06 VITALS — BP 107/71 | HR 92 | Wt 218.4 lb

## 2020-03-06 DIAGNOSIS — Z8632 Personal history of gestational diabetes: Secondary | ICD-10-CM

## 2020-03-06 DIAGNOSIS — O479 False labor, unspecified: Secondary | ICD-10-CM

## 2020-03-06 DIAGNOSIS — Z3A30 30 weeks gestation of pregnancy: Secondary | ICD-10-CM

## 2020-03-06 DIAGNOSIS — O09292 Supervision of pregnancy with other poor reproductive or obstetric history, second trimester: Secondary | ICD-10-CM

## 2020-03-06 DIAGNOSIS — Z3490 Encounter for supervision of normal pregnancy, unspecified, unspecified trimester: Secondary | ICD-10-CM

## 2020-03-06 NOTE — Patient Instructions (Signed)
[employer]osen's Emergency Medicine: Concepts and Clinical Practice (9th ed., pp. 2296- 2312). Elsevier.">  Braxton Hicks Contractions Contractions of the uterus can occur throughout pregnancy, but they are not always a sign that you are in labor. You may have practice contractions called Braxton Hicks contractions. These false labor contractions are sometimes confused with true labor. What are Braxton Hicks contractions? Braxton Hicks contractions are tightening movements that occur in the muscles of the uterus before labor. Unlike true labor contractions, these contractions do not result in opening (dilation) and thinning of the cervix. Toward the end of pregnancy (32-34 weeks), Braxton Hicks contractions can happen more often and may become stronger. These contractions are sometimes difficult to tell apart from true labor because they can be very uncomfortable. You should not feel embarrassed if you go to the hospital with false labor. Sometimes, the only way to tell if you are in true labor is for your health care provider to look for changes in the cervix. The health care provider will do a physical exam and may monitor your contractions. If you are not in true labor, the exam should show that your cervix is not dilating and your water has not broken. If there are no other health problems associated with your pregnancy, it is completely safe for you to be sent home with false labor. You may continue to have Braxton Hicks contractions until you go into true labor. How to tell the difference between true labor and false labor True labor  Contractions last 30-70 seconds.  Contractions become very regular.  Discomfort is usually felt in the top of the uterus, and it spreads to the lower abdomen and low back.  Contractions do not go away with walking.  Contractions usually become more intense and increase in frequency.  The cervix dilates and gets thinner. False labor  Contractions are usually shorter  and not as strong as true labor contractions.  Contractions are usually irregular.  Contractions are often felt in the front of the lower abdomen and in the groin.  Contractions may go away when you walk around or change positions while lying down.  Contractions get weaker and are shorter-lasting as time goes on.  The cervix usually does not dilate or become thin. Follow these instructions at home:  Take over-the-counter and prescription medicines only as told by your health care provider.  Keep up with your usual exercises and follow other instructions from your health care provider.  Eat and drink lightly if you think you are going into labor.  If Braxton Hicks contractions are making you uncomfortable: ? Change your position from lying down or resting to walking, or change from walking to resting. ? Sit and rest in a tub of warm water. ? Drink enough fluid to keep your urine pale yellow. Dehydration may cause these contractions. ? Do slow and deep breathing several times an hour.  Keep all follow-up prenatal visits as told by your health care provider. This is important.   Contact a health care provider if:  You have a fever.  You have continuous pain in your abdomen. Get help right away if:  Your contractions become stronger, more regular, and closer together.  You have fluid leaking or gushing from your vagina.  You pass blood-tinged mucus (bloody show).  You have bleeding from your vagina.  You have low back pain that you never had before.  You feel your baby's head pushing down and causing pelvic pressure.  Your baby is not moving inside   you as much as it used to. Summary  Contractions that occur before labor are called Braxton Hicks contractions, false labor, or practice contractions.  Braxton Hicks contractions are usually shorter, weaker, farther apart, and less regular than true labor contractions. True labor contractions usually become progressively  stronger and regular, and they become more frequent.  Manage discomfort from [facility] contractions by changing position, resting in a warm bath, drinking plenty of water, or practicing deep breathing. This information is not intended to replace advice given to you by your health care provider. Make sure you discuss any questions you have with your health care provider. Document Revised: 12/18/2016 Document Reviewed: 05/21/2016 Elsevier Patient Education  2021 Elsevier Inc.  Signs and Symptoms of Labor Labor is the body's natural process of moving the baby and the placenta out of the uterus. The process of labor usually starts when the baby is full-term, between 56 and 40 weeks of pregnancy. Signs and symptoms that you are close to going into labor As your body prepares for labor and the birth of your baby, you may notice the following symptoms in the weeks and days before true labor starts:  Passing a small amount of thick, bloody mucus from your vagina. This is called normal bloody show or losing your mucus plug. This may happen more than a week before labor begins, or right before labor begins, as the opening of the cervix starts to widen (dilate). For some women, the entire mucus plug passes at once. For others, pieces of the mucus plug may gradually pass over several days.  Your baby moving (dropping) lower in your pelvis to get into position for birth (lightening). When this happens, you may feel more pressure on your bladder and pelvic bone and less pressure on your ribs. This may make it easier to breathe. It may also cause you to need to urinate more often and have problems with bowel movements.  Having "practice contractions," also called Braxton Hicks contractions or false labor. These occur at irregular (unevenly spaced) intervals that are more than 10 minutes apart. False labor contractions are common after exercise or sexual activity. They will stop if you change position, rest, or  drink fluids. These contractions are usually mild and do not get stronger over time. They may feel like: ? A backache or back pain. ? Mild cramps, similar to menstrual cramps. ? Tightening or pressure in your abdomen. Other early symptoms include:  Nausea or loss of appetite.  Diarrhea.  Having a sudden burst of energy, or feeling very tired.  Mood changes.  Having trouble sleeping.   Signs and symptoms that labor has begun Signs that you are in labor may include:  Having contractions that come at regular (evenly spaced) intervals and increase in intensity. This may feel like more intense tightening or pressure in your abdomen that moves to your back. ? Contractions may also feel like rhythmic pain in your upper thighs or back that comes and goes at regular intervals. ? For first-time mothers, this change in intensity of contractions often occurs at a more gradual pace. ? Women who have given birth before may notice a more rapid progression of contraction changes.  Feeling pressure in the vaginal area.  Your water breaking (rupture of membranes). This is when the sac of fluid that surrounds your baby breaks. Fluid leaking from your vagina may be clear or blood-tinged. Labor usually starts within 24 hours of your water breaking, but it may take longer to begin. ?  Some women may feel a sudden gush of fluid. ? Others notice that their underwear repeatedly becomes damp. Follow these instructions at home:  When labor starts, or if your water breaks, call your health care provider or nurse care line. Based on your situation, they will determine when you should go in for an exam.  During early labor, you may be able to rest and manage symptoms at home. Some strategies to try at home include: ? Breathing and relaxation techniques. ? Taking a warm bath or shower. ? Listening to music. ? Using a heating pad on the lower back for pain. If you are directed to use heat:  Place a towel  between your skin and the heat source.  Leave the heat on for 20-30 minutes.  Remove the heat if your skin turns bright red. This is especially important if you are unable to feel pain, heat, or cold. You may have a greater risk of getting burned.   Contact a health care provider if:  Your labor has started.  Your water breaks. Get help right away if:  You have painful, regular contractions that are 5 minutes apart or less.  Labor starts before you are [redacted] weeks along in your pregnancy.  You have a fever.  You have bright red blood coming from your vagina.  You do not feel your baby moving.  You have a severe headache with or without vision problems.  You have severe nausea, vomiting, or diarrhea.  You have chest pain or shortness of breath. These symptoms may represent a serious problem that is an emergency. Do not wait to see if the symptoms will go away. Get medical help right away. Call your local emergency services (911 in the [place] Do not drive yourself to the hospital. Summary  Labor is your body's natural process of moving your baby and the placenta out of your uterus.  The process of labor usually starts when your baby is full-term, between 13 and 40 weeks of pregnancy.  When labor starts, or if your water breaks, call your health care provider or nurse care line. Based on your situation, they will determine when you should go in for an exam. This information is not intended to replace advice given to you by your health care provider. Make sure you discuss any questions you have with your health care provider. Document Revised: 10/28/2019 Document Reviewed: 10/28/2019 Elsevier Patient Education  2021 Elsevier Inc.  Kegel Exercises  Kegel exercises can help strengthen your pelvic floor muscles. The pelvic floor is a group of muscles that support your rectum, small intestine, and bladder. In females, pelvic floor muscles also help support the womb (uterus). These  muscles help you control the flow of urine and stool. Kegel exercises are painless and simple, and they do not require any equipment. Your provider may suggest Kegel exercises to:  Improve bladder and bowel control.  Improve sexual response.  Improve weak pelvic floor muscles after surgery to remove the uterus (hysterectomy) or pregnancy (females).  Improve weak pelvic floor muscles after prostate gland removal or surgery (males). Kegel exercises involve squeezing your pelvic floor muscles, which are the same muscles you squeeze when you try to stop the flow of urine or keep from passing gas. The exercises can be done while sitting, standing, or lying down, but it is best to vary your position. Exercises How to do Kegel exercises: 1. Squeeze your pelvic floor muscles tight. You should feel a tight lift in your rectal  area. If you are a female, you should also feel a tightness in your vaginal area. Keep your stomach, buttocks, and legs relaxed. 2. Hold the muscles tight for up to 10 seconds. 3. Breathe normally. 4. Relax your muscles. 5. Repeat as told by your health care provider. Repeat this exercise daily as told by your health care provider. Continue to do this exercise for at least 4-6 weeks, or for as long as told by your health care provider. You may be referred to a physical therapist who can help you learn more about how to do Kegel exercises. Depending on your condition, your health care provider may recommend:  Varying how long you squeeze your muscles.  Doing several sets of exercises every day.  Doing exercises for several weeks.  Making Kegel exercises a part of your regular exercise routine. This information is not intended to replace advice given to you by your health care provider. Make sure you discuss any questions you have with your health care provider. Document Revised: 05/12/2019 Document Reviewed: 08/25/2017 Elsevier Patient Education  2021 [employer].

## 2020-03-06 NOTE — Progress Notes (Signed)
    Subjective:  Katherine Holder is a 35 y.o. G3P1011 at [redacted]w[redacted]d being seen today for ongoing prenatal care.  She is currently monitored for the following issues for this low-risk pregnancy and has Supervision of low-risk pregnancy and Hx of gestational diabetes in prior pregnancy, currently pregnant, second trimester on their problem list.  Patient reports pelvic pain.  Contractions: Irritability. Vag. Bleeding: None.  Movement: Present. Denies leaking of fluid.   The following portions of the patient's history were reviewed and updated as appropriate: allergies, current medications, past family history, past medical history, past social history, past surgical history and problem list. Problem list updated.  Objective:   Vitals:   03/06/20 1114  BP: 107/71  Pulse: 92  Weight: 218 lb 6.4 oz (99.1 kg)    Fetal Status: Fetal Heart Rate (bpm): 142 Fundal Height: 34 cm Movement: Present     General:  Alert, oriented and cooperative. Patient is in no acute distress.  Skin: Skin is warm and dry. No rash noted.   Cardiovascular: Normal heart rate noted  Respiratory: Normal respiratory effort, no problems with respiration noted  Abdomen: Soft, gravid, appropriate for gestational age. Pain/Pressure: Present     Pelvic:  Cervical exam deferred        Extremities: Normal range of motion.  Edema: None  Mental Status: Normal mood and affect. Normal behavior. Normal judgment and thought content.   Urinalysis:      Assessment and Plan:  Pregnancy: G3P1011 at [redacted]w[redacted]d  1. Encounter for supervision of low-risk pregnancy, antepartum   2. Hx of gestational diabetes in prior pregnancy, currently pregnant, second trimester Normal 2 hr glucose  3. Braxton Hick's contraction Reports less than 4 per hour No leaking, no bleeding, baby moving well Having pubic symphysis pain Drink at least 8 8-oz glasses of water every day. Reviewed preterm labor - if contractions are more than 4 per hour and do not  respond to increased fluids and rest, go to hospital Reviewed entrance to use at [facility] for maternity visits.   Preterm labor symptoms and general obstetric precautions including but not limited to vaginal bleeding, contractions, leaking of fluid and fetal movement were reviewed in detail with the patient. Please refer to After Visit Summary for other counseling recommendations.  Return in about 2 weeks (around 03/20/2020) for in person rob.  Nolene Bernheim, RN, MSN, NP-BC Nurse Practitioner, [facility] for [employer], [facility] 03/06/2020 8:12 PM

## 2020-03-07 ENCOUNTER — Ambulatory Visit: Payer: 59 | Attending: Obstetrics

## 2020-03-07 ENCOUNTER — Ambulatory Visit: Payer: 59 | Admitting: *Deleted

## 2020-03-07 ENCOUNTER — Encounter: Payer: Self-pay | Admitting: *Deleted

## 2020-03-07 VITALS — BP 115/67 | HR 98

## 2020-03-07 DIAGNOSIS — Z3A30 30 weeks gestation of pregnancy: Secondary | ICD-10-CM | POA: Diagnosis not present

## 2020-03-07 DIAGNOSIS — O09293 Supervision of pregnancy with other poor reproductive or obstetric history, third trimester: Secondary | ICD-10-CM

## 2020-03-07 DIAGNOSIS — E669 Obesity, unspecified: Secondary | ICD-10-CM | POA: Diagnosis not present

## 2020-03-07 DIAGNOSIS — Z362 Encounter for other antenatal screening follow-up: Secondary | ICD-10-CM | POA: Insufficient documentation

## 2020-03-07 DIAGNOSIS — O3663X0 Maternal care for excessive fetal growth, third trimester, not applicable or unspecified: Secondary | ICD-10-CM | POA: Diagnosis present

## 2020-03-07 DIAGNOSIS — O99213 Obesity complicating pregnancy, third trimester: Secondary | ICD-10-CM | POA: Diagnosis not present

## 2020-03-20 ENCOUNTER — Other Ambulatory Visit: Payer: Self-pay

## 2020-03-20 ENCOUNTER — Ambulatory Visit (INDEPENDENT_AMBULATORY_CARE_PROVIDER_SITE_OTHER): Payer: 59 | Admitting: Nurse Practitioner

## 2020-03-20 ENCOUNTER — Encounter: Payer: Self-pay | Admitting: Nurse Practitioner

## 2020-03-20 VITALS — BP 110/67 | HR 98 | Wt 219.0 lb

## 2020-03-20 DIAGNOSIS — Z3493 Encounter for supervision of normal pregnancy, unspecified, third trimester: Secondary | ICD-10-CM

## 2020-03-20 DIAGNOSIS — Z3A32 32 weeks gestation of pregnancy: Secondary | ICD-10-CM

## 2020-03-20 NOTE — Progress Notes (Signed)
    Subjective:  Katherine Holder is a 35 y.o. G3P1011 at [redacted]w[redacted]d being seen today for ongoing prenatal care.  She is currently monitored for the following issues for this low-risk pregnancy and has Supervision of low-risk pregnancy and Hx of gestational diabetes in prior pregnancy, currently pregnant, second trimester on their problem list.  Patient reports more aches and pains in this pregnancy than her first 4 years ago.  Contractions: Not present. Vag. Bleeding: None.  Movement: Present. Denies leaking of fluid.   The following portions of the patient's history were reviewed and updated as appropriate: allergies, current medications, past family history, past medical history, past social history, past surgical history and problem list. Problem list updated.  Objective:   Vitals:   03/20/20 1014  BP: 110/67  Pulse: 98  Weight: 219 lb (99.3 kg)    Fetal Status: Fetal Heart Rate (bpm): 135 Fundal Height: 35 cm Movement: Present     General:  Alert, oriented and cooperative. Patient is in no acute distress.  Skin: Skin is warm and dry. No rash noted.   Cardiovascular: Normal heart rate noted  Respiratory: Normal respiratory effort, no problems with respiration noted  Abdomen: Soft, gravid, appropriate for gestational age. Pain/Pressure: Present     Pelvic:  Cervical exam deferred        Extremities: Normal range of motion.  Edema: None  Mental Status: Normal mood and affect. Normal behavior. Normal judgment and thought content.   Urinalysis:      Assessment and Plan:  Pregnancy: G3P1011 at [redacted]w[redacted]d  1. Encounter for supervision of low-risk pregnancy in third trimester Doing well Husband is in [place] and unable to get visa to come for the birth. Planning to bring her 59 year old and her mother to her birth as she does not have friends here and no child care options. Will have navigator reach out to her about resources. Declines TDAP - reviewed last visit as a prevention for her child  contracting whooping cough Has contacted pediatrician and is awaiting final approval to join the practice - will bring info to next visit. Does not plan to get the Covid Booster   Preterm labor symptoms and general obstetric precautions including but not limited to vaginal bleeding, contractions, leaking of fluid and fetal movement were reviewed in detail with the patient. Please refer to After Visit Summary for other counseling recommendations.  Return in about 2 weeks (around 04/03/2020) for ROB in person.  Nolene Bernheim, RN, MSN, NP-BC Nurse Practitioner, [facility] for [employer], [facility] 03/20/2020 12:42 PM

## 2020-04-02 ENCOUNTER — Ambulatory Visit (INDEPENDENT_AMBULATORY_CARE_PROVIDER_SITE_OTHER): Payer: 59 | Admitting: Advanced Practice Midwife

## 2020-04-02 ENCOUNTER — Other Ambulatory Visit: Payer: Self-pay

## 2020-04-02 VITALS — BP 121/74 | HR 101 | Wt 219.5 lb

## 2020-04-02 DIAGNOSIS — Z3493 Encounter for supervision of normal pregnancy, unspecified, third trimester: Secondary | ICD-10-CM

## 2020-04-02 DIAGNOSIS — Z3A34 34 weeks gestation of pregnancy: Secondary | ICD-10-CM

## 2020-04-02 DIAGNOSIS — O99210 Obesity complicating pregnancy, unspecified trimester: Secondary | ICD-10-CM

## 2020-04-02 NOTE — Patient Instructions (Signed)

## 2020-04-02 NOTE — Progress Notes (Signed)
   PRENATAL VISIT NOTE  Subjective:  Vesper Trant is a 35 y.o. G3P1011 at [redacted]w[redacted]d being seen today for ongoing prenatal care.  She is currently monitored for the following issues for this low-risk pregnancy and has Supervision of low-risk pregnancy and Hx of gestational diabetes in prior pregnancy, currently pregnant, second trimester on their problem list.  Patient reports no complaints.  Contractions: Not present. Vag. Bleeding: None.  Movement: Present. Denies leaking of fluid.   The following portions of the patient's history were reviewed and updated as appropriate: allergies, current medications, past family history, past medical history, past social history, past surgical history and problem list. Problem list updated.  Objective:   Vitals:   04/02/20 1041  BP: 121/74  Pulse: (!) 101  Weight: 219 lb 8 oz (99.6 kg)    Fetal Status: Fetal Heart Rate (bpm): 152 Fundal Height: 36 cm Movement: Present     General:  Alert, oriented and cooperative. Patient is in no acute distress.  Skin: Skin is warm and dry. No rash noted.   Cardiovascular: Normal heart rate noted  Respiratory: Normal respiratory effort, no problems with respiration noted  Abdomen: Soft, gravid, appropriate for gestational age.  Pain/Pressure: Present     Pelvic: Cervical exam deferred        Extremities: Normal range of motion.  Edema: None  Mental Status: Normal mood and affect. Normal behavior. Normal judgment and thought content.   Assessment and Plan:  Pregnancy: G3P1011 at [redacted]w[redacted]d  1. Encounter for supervision of low-risk pregnancy in third trimester - LOB  2. [redacted] weeks gestation of pregnancy - GBS and GC swabs next visit  3. Obesity in pregnancy - TWG 12 lbs, reassured this is not "too little weight gain". Doing very well!  Preterm labor symptoms and general obstetric precautions including but not limited to vaginal bleeding, contractions, leaking of fluid and fetal movement were reviewed in detail with  the patient. Please refer to After Visit Summary for other counseling recommendations.  Return in about 2 weeks (around 04/16/2020).  Future Appointments  Date Time Provider Department Center  04/18/2020  9:35 AM Burleson, Brand Males, NP [facility]    Calvert Cantor, CNM

## 2020-04-02 NOTE — Progress Notes (Signed)
Pt ask for Transportation, due to living in [facility]. Pt signed [employer] waiver form & Transportation has been notified via email.

## 2020-04-18 ENCOUNTER — Ambulatory Visit (INDEPENDENT_AMBULATORY_CARE_PROVIDER_SITE_OTHER): Payer: 59 | Admitting: Nurse Practitioner

## 2020-04-18 ENCOUNTER — Other Ambulatory Visit: Payer: Self-pay

## 2020-04-18 ENCOUNTER — Encounter: Payer: Self-pay | Admitting: Nurse Practitioner

## 2020-04-18 ENCOUNTER — Other Ambulatory Visit (HOSPITAL_COMMUNITY)
Admission: RE | Admit: 2020-04-18 | Discharge: 2020-04-18 | Disposition: A | Discharge status: Home / Self Care | Payer: 59 | Source: Ambulatory Visit | Attending: Nurse Practitioner | Admitting: Nurse Practitioner

## 2020-04-18 VITALS — BP 108/82 | HR 105 | Wt 222.0 lb

## 2020-04-18 DIAGNOSIS — Z3493 Encounter for supervision of normal pregnancy, unspecified, third trimester: Secondary | ICD-10-CM | POA: Diagnosis present

## 2020-04-18 DIAGNOSIS — Z8632 Personal history of gestational diabetes: Secondary | ICD-10-CM

## 2020-04-18 DIAGNOSIS — Z3490 Encounter for supervision of normal pregnancy, unspecified, unspecified trimester: Secondary | ICD-10-CM

## 2020-04-18 DIAGNOSIS — Z3A36 36 weeks gestation of pregnancy: Secondary | ICD-10-CM | POA: Diagnosis not present

## 2020-04-18 DIAGNOSIS — O09292 Supervision of pregnancy with other poor reproductive or obstetric history, second trimester: Secondary | ICD-10-CM

## 2020-04-18 NOTE — Progress Notes (Signed)
Pressure on vagina but denies any pain

## 2020-04-18 NOTE — Patient Instructions (Signed)

## 2020-04-18 NOTE — Progress Notes (Signed)
    Subjective:  Katherine Holder is a 35 y.o. G3P1011 at [redacted]w[redacted]d being seen today for ongoing prenatal care.  She is currently monitored for the following issues for this low-risk pregnancy and has Supervision of low-risk pregnancy and Hx of gestational diabetes in prior pregnancy, currently pregnant, second trimester on their problem list.  Patient reports some trouble sleeping and pain when lying on her side in bed.  Prefers lying on her back..  Contractions: Irregular. Vag. Bleeding: None.  Movement: Present. Denies leaking of fluid.   The following portions of the patient's history were reviewed and updated as appropriate: allergies, current medications, past family history, past medical history, past social history, past surgical history and problem list. Problem list updated.  Objective:   Vitals:   04/18/20 0945  BP: 108/82  Pulse: (!) 105  Weight: 222 lb (100.7 kg)    Fetal Status: Fetal Heart Rate (bpm): 140 Fundal Height: 38 cm Movement: Present  Presentation: Vertex  General:  Alert, oriented and cooperative. Patient is in no acute distress.  Skin: Skin is warm and dry. No rash noted.   Cardiovascular: Normal heart rate noted  Respiratory: Normal respiratory effort, no problems with respiration noted  Abdomen: Soft, gravid, appropriate for gestational age. Pain/Pressure: Present     Pelvic:  Cervical exam performed Dilation: 1 Effacement (%): Thick Station: Ballotable  Vaginal swabs done  Extremities: Normal range of motion.  Edema: None  Mental Status: Normal mood and affect. Normal behavior. Normal judgment and thought content.   Urinalysis:      Assessment and Plan:  Pregnancy: G3P1011 at [redacted]w[redacted]d  1. Encounter for supervision of low-risk pregnancy, antepartum No swelling Discussed positioning at night and reviewed slight turning to the side rather than full side lying.  2. Hx of gestational diabetes in prior pregnancy, currently pregnant, second trimester 2 hr glucola  normal Fundal height consistent   Term labor symptoms and general obstetric precautions including but not limited to vaginal bleeding, contractions, leaking of fluid and fetal movement were reviewed in detail with the patient. Please refer to After Visit Summary for other counseling recommendations.  Return in about 1 week (around 04/25/2020) for in person ROB.  Nolene Bernheim, RN, MSN, NP-BC Nurse Practitioner, [facility] for [employer], [facility] 04/18/2020 10:15 AM

## 2020-04-18 NOTE — Addendum Note (Signed)
Addended by: Guy Begin on: 04/18/2020 10:49 AM   Modules accepted: Orders

## 2020-04-19 LAB — CERVICOVAGINAL ANCILLARY ONLY
Chlamydia: NEGATIVE
Comment: NEGATIVE
Comment: NORMAL
Neisseria Gonorrhea: NEGATIVE

## 2020-04-23 LAB — CULTURE, BETA STREP (GROUP B ONLY): Strep Gp B Culture: NEGATIVE

## 2020-04-24 ENCOUNTER — Inpatient Hospital Stay (HOSPITAL_COMMUNITY)
Admission: AD | Admit: 2020-04-24 | Discharge: 2020-04-24 | Disposition: A | Discharge status: Home / Self Care | Payer: 59 | Attending: Obstetrics & Gynecology | Admitting: Obstetrics & Gynecology

## 2020-04-24 ENCOUNTER — Encounter (HOSPITAL_COMMUNITY): Payer: Self-pay | Admitting: Obstetrics & Gynecology

## 2020-04-24 ENCOUNTER — Other Ambulatory Visit: Payer: Self-pay

## 2020-04-24 DIAGNOSIS — O368130 Decreased fetal movements, third trimester, not applicable or unspecified: Secondary | ICD-10-CM | POA: Insufficient documentation

## 2020-04-24 DIAGNOSIS — Z3A37 37 weeks gestation of pregnancy: Secondary | ICD-10-CM | POA: Insufficient documentation

## 2020-04-24 DIAGNOSIS — O99891 Other specified diseases and conditions complicating pregnancy: Secondary | ICD-10-CM

## 2020-04-24 DIAGNOSIS — M549 Dorsalgia, unspecified: Secondary | ICD-10-CM | POA: Diagnosis not present

## 2020-04-24 DIAGNOSIS — O26893 Other specified pregnancy related conditions, third trimester: Secondary | ICD-10-CM | POA: Insufficient documentation

## 2020-04-24 LAB — URINALYSIS, ROUTINE W REFLEX MICROSCOPIC
Bilirubin Urine: NEGATIVE
Glucose, UA: NEGATIVE mg/dL
Hgb urine dipstick: NEGATIVE
Ketones, ur: NEGATIVE mg/dL
Nitrite: NEGATIVE
Protein, ur: NEGATIVE mg/dL
Specific Gravity, Urine: 1.012 (ref 1.005–1.030)
pH: 6 (ref 5.0–8.0)

## 2020-04-24 MED ORDER — CYCLOBENZAPRINE HCL 10 MG PO TABS: 5.0000 mg | ORAL_TABLET | Freq: Three times a day (TID) | ORAL | 0 refills | Status: DC | PRN

## 2020-04-24 NOTE — MAU Provider Note (Signed)
Chief Complaint:  Back Pain   Event Date/Time   First Provider Initiated Contact with Patient 04/24/20 1128      HPI: Katherine Holder is a 35 y.o. G3P1011 at [redacted]w[redacted]d by early ultrasound who presents to maternity admissions reporting onset of severe back pain yesterday and decreased fetal movement today. She has some intermittent abdominal pain too but the back pain is constant and more severe.  There is some light mucus discharge but no leaking fluid or vaginal bleeding.   Location: low back Quality: sharp Severity: 8/10 on pain scale Duration: 1 day Timing: constant, radiates up into mid back intermittently Modifying factors: none, not affected by position or movement Associated signs and symptoms: decreased fetal movement and abdominal cramping  HPI  Past Medical History: Past Medical History:  Diagnosis Date  . Depression     Past obstetric history: OB History  Gravida Para Term Preterm AB Living  3 1 1  0 1 1  SAB IAB Ectopic Multiple Live Births  1 0 0 0 1    # Outcome Date GA Lbr Len/2nd Weight Sex Delivery Anes PTL Lv  3 Current           2 SAB 04/2018          1 Term 03/31/16 [redacted]w[redacted]d   F Vag-Spont None  LIV    Past Surgical History: History reviewed. No pertinent surgical history.  Family History: Family History  Problem Relation Age of Onset  . Diabetes Mother     Social History: Social History   Tobacco Use  . Smoking status: Never Smoker  . Smokeless tobacco: Never Used  Vaping Use  . Vaping Use: Never used  Substance Use Topics  . Alcohol use: Never  . Drug use: Never    Allergies: No Known Allergies  Meds:  No medications prior to admission.    ROS:  Review of Systems  Constitutional: Negative for chills, fatigue and fever.  Eyes: Negative for visual disturbance.  Respiratory: Negative for shortness of breath.   Cardiovascular: Negative for chest pain.  Gastrointestinal: Positive for abdominal pain. Negative for nausea and vomiting.   Genitourinary: Positive for vaginal discharge. Negative for difficulty urinating, dysuria, flank pain, pelvic pain, vaginal bleeding and vaginal pain.  Musculoskeletal: Positive for back pain.  Neurological: Negative for dizziness and headaches.  Psychiatric/Behavioral: Negative.      I have reviewed patient's Past Medical Hx, Surgical Hx, Family Hx, Social Hx, medications and allergies.   Physical Exam   Patient Vitals for the past 24 hrs:  BP Temp Temp src Pulse Resp SpO2  04/24/20 1219 121/68 -- -- 90 14 99 %  04/24/20 1114 112/76 98.3 F (36.8 C) Oral (!) 101 15 100 %   Constitutional: Well-developed, well-nourished female in no acute distress.  Cardiovascular: normal rate Respiratory: normal effort GI: Abd soft, non-tender, gravid appropriate for gestational age.  MS: Extremities nontender, no edema, normal ROM Neurologic: Alert and oriented x 4.  GU: Neg CVAT.  Dilation: 3 Effacement (%): 50 Station: -3 Presentation: Vertex Exam by:: [email or web address], CNM  FHT:  Baseline 130 , moderate variability, accelerations present, no decelerations Contractions: irritability   Labs: Results for orders placed or performed during the hospital encounter of 04/24/20 (from the past 24 hour(s))  Urinalysis, Routine w reflex microscopic Urine, Clean Catch     Status: Abnormal   Collection Time: 04/24/20 11:15 AM  Result Value Ref Range   Color, Urine YELLOW YELLOW   APPearance HAZY (A) CLEAR  Specific Gravity, Urine 1.012 1.005 - 1.030   pH 6.0 5.0 - 8.0   Glucose, UA NEGATIVE NEGATIVE mg/dL   Hgb urine dipstick NEGATIVE NEGATIVE   Bilirubin Urine NEGATIVE NEGATIVE   Ketones, ur NEGATIVE NEGATIVE mg/dL   Protein, ur NEGATIVE NEGATIVE mg/dL   Nitrite NEGATIVE NEGATIVE   Leukocytes,Ua TRACE (A) NEGATIVE   RBC / HPF 0-5 0 - 5 RBC/hpf   WBC, UA 0-5 0 - 5 WBC/hpf   Bacteria, UA RARE (A) NONE SEEN   Squamous Epithelial / LPF 0-5 0 - 5   Mucus PRESENT    B/Positive/--  (11/04 1556)  Imaging:  No results found.  MAU Course/MDM: Orders Placed This Encounter  Procedures  . Urinalysis, Routine w reflex microscopic Urine, Clean Catch  . Discharge patient    Meds ordered this encounter  Medications  . cyclobenzaprine (FLEXERIL) 10 MG tablet    Sig: Take 0.5-1 tablets (5-10 mg total) by mouth 3 (three) times daily as needed for muscle spasms.    Dispense:  20 tablet    Refill:  0    Order Specific Question:   Supervising Provider    Answer:   Myna Hidalgo [[id number]]     NST reviewed and reactive Pt is feeling normal fetal movement in MAU No evidence of UTI, no CVA tenderness or hematuria to indicate renal calculi No evidence of labor and back pain is constant with minimal contractions reported by pt or seen on toco Back pain c/w musculoskeletal pain Rest/ice/heat/warm bath/Tylenol/pregnancy support belt Add Flexeril PRN F/U in office as scheduled Return to MAU with signs of labor or emergencies   Assessment: 1. Back pain affecting pregnancy in third trimester   2. Musculoskeletal back pain   3. [redacted] weeks gestation of pregnancy     Plan: Discharge home Labor precautions and fetal kick counts  Follow-up Information    [facility] Follow up.   Specialty: Obstetrics and Gynecology Why: As scheduled, return to MAU for signs of labor or emergencies Contact information: [address] [place] [address] [phone]             Allergies as of 04/24/2020   No Known Allergies     Medication List    TAKE these medications   cyclobenzaprine 10 MG tablet Commonly known as: FLEXERIL Take 0.5-1 tablets (5-10 mg total) by mouth 3 (three) times daily as needed for muscle spasms.   Prenatal Vitamins 28-0.8 MG Tabs Take 1 tablet by mouth daily.       Sharen Counter Certified Nurse-Midwife 04/24/2020 7:34 PM

## 2020-04-24 NOTE — MAU Note (Signed)
.   Katherine Holder is a 35 y.o. at [redacted]w[redacted]d here in MAU reporting: constant lower back pain that started last night. She states that sometimes it shoots up her back. Denies ctx. Is also reporting that she has not felt the baby move since 2100 last night. Reports she is having clear thick discharge. No VB.   Pain score: 8 Vitals:   04/24/20 1114  BP: 112/76  Pulse: (!) 101  Resp: 15  Temp: 98.3 F (36.8 C)  SpO2: 100%     FHT:135 Lab orders placed from triage: UA

## 2020-04-24 NOTE — Discharge Instructions (Signed)
Back Pain in Pregnancy Back pain during pregnancy is common. Back pain may be caused by several factors that are related to changes during your pregnancy. Follow these instructions at home: Managing pain, stiffness, and swelling  If directed, for sudden (acute) back pain, put ice on the painful area. ? Put ice in a plastic bag. ? Place a towel between your skin and the bag. ? Leave the ice on for 20 minutes, 2-3 times per day.  If directed, apply heat to the affected area before you exercise. Use the heat source that your health care provider recommends, such as a moist heat pack or a heating pad. ? Place a towel between your skin and the heat source. ? Leave the heat on for 20-30 minutes. ? Remove the heat if your skin turns bright red. This is especially important if you are unable to feel pain, heat, or cold. You may have a greater risk of getting burned.  If directed, massage the affected area.      Activity  Exercise as told by your health care provider. Gentle exercise is the best way to prevent or manage back pain.  Listen to your body when lifting. If lifting hurts, ask for help or bend your knees. This uses your leg muscles instead of your back muscles.  Squat down when picking up something from the floor. Do not bend over.  Only use bed rest for short periods as told by your health care provider. Bed rest should only be used for the most severe episodes of back pain. Standing, sitting, and lying down  Do not stand in one place for long periods of time.  Use good posture when sitting. Make sure your head rests over your shoulders and is not hanging forward. Use a pillow on your lower back if necessary.  Try sleeping on your side, preferably the left side, with a pregnancy support pillow or 1-2 regular pillows between your legs. ? If you have back pain after a night's rest, your bed may be too soft. ? A firm mattress may provide more support for your back during  pregnancy. General instructions  Do not wear high heels.  Eat a healthy diet. Try to gain weight within your health care provider's recommendations.  Use a maternity girdle, elastic sling, or back brace as told by your health care provider.  Take over-the-counter and prescription medicines only as told by your health care provider.  Work with a physical therapist or massage therapist to find ways to manage back pain. Acupuncture or massage therapy may be helpful.  Keep all follow-up visits as told by your health care provider. This is important. Contact a health care provider if:  Your back pain interferes with your daily activities.  You have increasing pain in other parts of your body. Get help right away if:  You develop numbness, tingling, weakness, or problems with the use of your arms or legs.  You develop severe back pain that is not controlled with medicine.  You have a change in bowel or bladder control.  You develop shortness of breath, dizziness, or you faint.  You develop nausea, vomiting, or sweating.  You have back pain that is a rhythmic, cramping pain similar to labor pains. Labor pain is usually 1-2 minutes apart, lasts for about 1 minute, and involves a bearing down feeling or pressure in your pelvis.  You have back pain and your water breaks or you have vaginal bleeding.  You have back pain or   numbness that travels down your leg.  Your back pain developed after you fell.  You develop pain on one side of your back.  You see blood in your urine.  You develop skin blisters in the area of your back pain. Summary  Back pain may be caused by several factors that are related to changes during your pregnancy.  Follow instructions as told by your health care provider for managing pain, stiffness, and swelling.  Exercise as told by your health care provider. Gentle exercise is the best way to prevent or manage back pain.  Take over-the-counter and  prescription medicines only as told by your health care provider.  Keep all follow-up visits as told by your health care provider. This is important. This information is not intended to replace advice given to you by your health care provider. Make sure you discuss any questions you have with your health care provider. Document Revised: 04/26/2018 Document Reviewed: 06/23/2017 Elsevier Patient Education  2021 [employer] Fetal Movement Counts Patient Name: ________________________________________________ Patient Due Date: ____________________  What is a fetal movement count? A fetal movement count is the number of times that you feel your baby move during a certain amount of time. This may also be called a fetal kick count. A fetal movement count is recommended for every pregnant woman. You may be asked to start counting fetal movements as early as week 28 of your pregnancy. Pay attention to when your baby is most active. You may notice your baby's sleep and wake cycles. You may also notice things that make your baby move more. You should do a fetal movement count:  When your baby is normally most active.  At the same time each day. A good time to count movements is while you are resting, after having something to eat and drink. How do I count fetal movements? 1. Find a quiet, comfortable area. Sit, or lie down on your side. 2. Write down the date, the start time and stop time, and the number of movements that you felt between those two times. Take this information with you to your health care visits. 3. Write down your start time when you feel the first movement. 4. Count kicks, flutters, swishes, rolls, and jabs. You should feel at least 10 movements. 5. You may stop counting after you have felt 10 movements, or if you have been counting for 2 hours. Write down the stop time. 6. If you do not feel 10 movements in 2 hours, contact your health care provider for further instructions. Your  health care provider may want to do additional tests to assess your baby's well-being. Contact a health care provider if:  You feel fewer than 10 movements in 2 hours.  Your baby is not moving like he or she usually does. Date: ____________ Start time: ____________ Stop time: ____________ Movements: ____________ Date: ____________ Start time: ____________ Stop time: ____________ Movements: ____________ Date: ____________ Start time: ____________ Stop time: ____________ Movements: ____________ Date: ____________ Start time: ____________ Stop time: ____________ Movements: ____________ Date: ____________ Start time: ____________ Stop time: ____________ Movements: ____________ Date: ____________ Start time: ____________ Stop time: ____________ Movements: ____________ Date: ____________ Start time: ____________ Stop time: ____________ Movements: ____________ Date: ____________ Start time: ____________ Stop time: ____________ Movements: ____________ Date: ____________ Start time: ____________ Stop time: ____________ Movements: ____________ This information is not intended to replace advice given to you by your health care provider. Make sure you discuss any questions you have with your health care provider. Document  Revised: 08/25/2018 Document Reviewed: 08/25/2018 Elsevier Patient Education  2021 [employer].

## 2020-04-25 ENCOUNTER — Telehealth (INDEPENDENT_AMBULATORY_CARE_PROVIDER_SITE_OTHER): Payer: 59 | Admitting: Obstetrics and Gynecology

## 2020-04-25 VITALS — BP 112/76

## 2020-04-25 DIAGNOSIS — Z3493 Encounter for supervision of normal pregnancy, unspecified, third trimester: Secondary | ICD-10-CM

## 2020-04-25 DIAGNOSIS — Z3A37 37 weeks gestation of pregnancy: Secondary | ICD-10-CM | POA: Diagnosis not present

## 2020-04-25 NOTE — Progress Notes (Signed)
    TELEHEALTH OBSTETRICS VISIT ENCOUNTER NOTE  Provider location: [facility]   Patient location: Home  I connected with Katherine Holder on 04/25/20 at 10:35 AM EDT by telephone at home and verified that I am speaking with the correct person using two identifiers. Of note, unable to do video encounter due to technical difficulties.    I discussed the limitations, risks, security and privacy concerns of performing an evaluation and management service by telephone and the availability of in person appointments. I also discussed with the patient that there may be a patient responsible charge related to this service. The patient expressed understanding and agreed to proceed.  Subjective:  Katherine Holder is a 35 y.o. G3P1011 at [redacted]w[redacted]d being followed for ongoing prenatal care.  She is currently monitored for the following issues for this low-risk pregnancy and has Supervision of low-risk pregnancy and Hx of gestational diabetes in prior pregnancy, currently pregnant, second trimester on their problem list.  Reports fetal movement. Denies any contractions, bleeding or leaking of fluid.   MAU yesterday morning and was 3cm. Tired from the flexeril this morning  The following portions of the patient's history were reviewed and updated as appropriate: allergies, current medications, past family history, past medical history, past social history, past surgical history and problem list.   Objective:  Last menstrual period 07/29/2019. General:  Alert, oriented and cooperative.   Mental Status: Normal mood and affect perceived. Normal judgment and thought content.  Rest of physical exam deferred due to type of encounter  Assessment and Plan:  Pregnancy: G3P1011 at [redacted]w[redacted]d 1. Encounter for supervision of low-risk pregnancy in third trimester Routine care  2. [redacted] weeks gestation of pregnancy  Term labor symptoms and general obstetric precautions including but not  limited to vaginal bleeding, contractions, leaking of fluid and fetal movement were reviewed in detail with the patient.  I discussed the assessment and treatment plan with the patient. The patient was provided an opportunity to ask questions and all were answered. The patient agreed with the plan and demonstrated an understanding of the instructions. The patient was advised to call back or seek an in-person office evaluation/go to MAU at [facility] for any urgent or concerning symptoms. Please refer to After Visit Summary for other counseling recommendations.   I provided 7 minutes of non-face-to-face time during this encounter.  No follow-ups on file.  No future appointments.  [place] Bing, MD Center for [employer], [facility]

## 2020-04-29 ENCOUNTER — Encounter: Payer: Self-pay | Admitting: *Deleted

## 2020-05-01 ENCOUNTER — Encounter: Payer: 59 | Admitting: Obstetrics and Gynecology

## 2020-05-02 ENCOUNTER — Encounter (HOSPITAL_COMMUNITY): Payer: Self-pay | Admitting: Obstetrics & Gynecology

## 2020-05-02 ENCOUNTER — Inpatient Hospital Stay (HOSPITAL_COMMUNITY)
Admission: AD | Admit: 2020-05-02 | Discharge: 2020-05-02 | Disposition: A | Discharge status: Home / Self Care | Payer: 59 | Attending: Obstetrics & Gynecology | Admitting: Obstetrics & Gynecology

## 2020-05-02 DIAGNOSIS — Z3A39 39 weeks gestation of pregnancy: Secondary | ICD-10-CM | POA: Diagnosis not present

## 2020-05-02 DIAGNOSIS — O479 False labor, unspecified: Secondary | ICD-10-CM

## 2020-05-02 DIAGNOSIS — O471 False labor at or after 37 completed weeks of gestation: Secondary | ICD-10-CM | POA: Diagnosis present

## 2020-05-02 LAB — URINALYSIS, ROUTINE W REFLEX MICROSCOPIC
Bilirubin Urine: NEGATIVE
Glucose, UA: NEGATIVE mg/dL
Hgb urine dipstick: NEGATIVE
Ketones, ur: NEGATIVE mg/dL
Leukocytes,Ua: NEGATIVE
Nitrite: NEGATIVE
Protein, ur: NEGATIVE mg/dL
Specific Gravity, Urine: 1.008 (ref 1.005–1.030)
pH: 6 (ref 5.0–8.0)

## 2020-05-02 NOTE — Discharge Instructions (Signed)
First Stage of Labor Labor is your body's natural process of moving your baby and other structures, including the placenta and umbilical cord, out of your uterus. There are three stages of labor. How long each stage lasts is different for every woman. But certain events happen during each stage that are the same for everyone.  The first stage starts when true labor begins. This stage ends when your cervix, which is the opening from your uterus into your vagina, is completely open (dilated).  The second stage begins when your cervix is fully dilated and you start pushing. This stage ends when your baby is born.  The third stage is the delivery of the organ that nourished your baby during pregnancy (placenta). First stage of labor As your due date gets closer, you may start to notice certain physical changes that mean labor is going to start soon. You may feel that your baby has dropped lower into your pelvis. You may experience irregular, often painless, contractions that go away when you walk around or lie down (Braxton Hicks contractions). This is also called false labor. The first stage of labor begins when you start having contractions that come at regular (evenly spaced) intervals and your cervix starts to get thinner and wider in preparation for your baby to pass through. Birth care providers measure the dilation of your cervix in centimeters (cm). One centimeter is a little less than one-half of an inch. The first stage ends when your cervix is dilated to 10 cm. The first stage of labor is divided into three phases:  Early phase.  Active phase.  Transitional phase. The length of the first stage of labor varies. It may be longer if this is your first pregnancy. You may spend most of this stage at home trying to relax and stay comfortable. How does this affect me? During the first stage of labor, you will move through three phases. What happens in the early phase?  You will start to have  regular contractions that last 30-60 seconds. Contractions may come every 5-20 minutes. Keep track of your contractions and call your birth care provider.  Your water may break during this phase.  You may notice a clear or slightly bloody discharge of mucus (mucus plug) from your vagina.  Your cervix will dilate to 3-6 cm. What happens in the active phase? The active phase usually lasts 3-5 hours. You may go to the hospital or birth center around this time. During the active phase:  Your contractions will become stronger, longer, and more uncomfortable.  Your contractions may last 45-90 seconds and come every 3-5 minutes.  You may feel lower back pain.  Your birth care providers may examine your cervix and feel your belly to find the position of your baby.  You may have a monitor strapped to your belly to measure your contractions and your baby's heart rate.  You may start using your pain management options.  Your cervix may be dilated to 6 cm and may start to dilate more quickly. What happens in the transitional phase? The transitional phase typically lasts from 30 minutes to 2 hours. At the end of this phase, your cervix will be fully dilated to 10 cm. During the transitional phase:  Contractions will get stronger and longer.  Contractions may last 60-90 seconds and come less than 2 minutes apart.  You may feel hot flashes, chills, or nausea. How does this affect my baby? During the first stage of labor, your baby will   gradually move down into your birth canal. Follow these instructions at home and in the hospital or birth center:  When labor first begins, try to stay calm. You are still in the early phase. If it is night, try to get some sleep. If it is day, try to relax and save your energy. You may want to make some calls and get ready to go to the hospital or birth center.  When you are in the early phase, try these methods to help ease discomfort: ? Deep breathing and  muscle relaxation. ? Taking a walk. ? Taking a warm bath or shower.  Drink some fluids and have a light snack if you feel like it.  Keep track of your contractions.  Based on the plan you created with your birth care provider, call when your contractions indicate it is time.  If your water breaks, note the time, color, and odor of the fluid.  When you are in the active phase, do your breathing exercises and rely on your support people and your team of birth care providers.   Contact a health care provider if:  Your contractions are strong and regular.  You have lower back pain or cramping.  Your water breaks.  You lose your mucus plug. Get help right away if you:  Have a severe headache that does not go away.  Have changes in your vision.  Have severe pain in your upper belly.  Do not feel the baby move.  Have bright red bleeding. Summary  The first stage of labor starts when true labor begins, and it ends when your cervix is dilated to 10 cm.  The first stage of labor has three phases: early, active, and transitional.  Your baby moves into the birth canal during the first stage of labor.  You may have contractions that become stronger and longer. You may also lose your mucus plug and have your water break.  Call your birth care provider when your contractions are frequent and strong enough to go to the hospital or birth center. This information is not intended to replace advice given to you by your health care provider. Make sure you discuss any questions you have with your health care provider. Document Revised: 04/28/2018 Document Reviewed: 03/21/2017 Elsevier Patient Education  2021 Elsevier Inc.    [employer] of the uterus can occur throughout pregnancy, but they are not always a sign that you are in labor. You may have practice contractions called Braxton Hicks contractions. These false labor contractions are sometimes confused  with true labor. What are Deberah Pelton contractions? Braxton Hicks contractions are tightening movements that occur in the muscles of the uterus before labor. Unlike true labor contractions, these contractions do not result in opening (dilation) and thinning of the cervix. Toward the end of pregnancy (32-34 weeks), Braxton Hicks contractions can happen more often and may become stronger. These contractions are sometimes difficult to tell apart from true labor because they can be very uncomfortable. You should not feel embarrassed if you go to the hospital with false labor. Sometimes, the only way to tell if you are in true labor is for your health care provider to look for changes in the cervix. The health care provider will do a physical exam and may monitor your contractions. If you are not in true labor, the exam should show that your cervix is not dilating and your water has not broken. If there are no other health problems associated with  it is completely safe for you to be sent home with false labor. You may continue to have Braxton Hicks contractions until you go into true labor. How to tell the difference between true labor and false labor True labor  Contractions last 30-70 seconds.  Contractions become very regular.  Discomfort is usually felt in the top of the uterus, and it spreads to the lower abdomen and low back.  Contractions do not go away with walking.  Contractions usually become more intense and increase in frequency.  The cervix dilates and gets thinner. False labor  Contractions are usually shorter and not as strong as true labor contractions.  Contractions are usually irregular.  Contractions are often felt in the front of the lower abdomen and in the groin.  Contractions may go away when you walk around or change positions while lying down.  Contractions get weaker and are shorter-lasting as time goes on.  The cervix usually does not dilate or become  thin. Follow these instructions at home:  Take over-the-counter and prescription medicines only as told by your health care provider.  Keep up with your usual exercises and follow other instructions from your health care provider.  Eat and drink lightly if you think you are going into labor.  If Braxton Hicks contractions are making you uncomfortable: ? Change your position from lying down or resting to walking, or change from walking to resting. ? Sit and rest in a tub of warm water. ? Drink enough fluid to keep your urine pale yellow. Dehydration may cause these contractions. ? Do slow and deep breathing several times an hour.  Keep all follow-up prenatal visits as told by your health care provider. This is important.   Contact a health care provider if:  You have a fever.  You have continuous pain in your abdomen. Get help right away if:  Your contractions become stronger, more regular, and closer together.  You have fluid leaking or gushing from your vagina.  You pass blood-tinged mucus (bloody show).  You have bleeding from your vagina.  You have low back pain that you never had before.  You feel your baby's head pushing down and causing pelvic pressure.  Your baby is not moving inside you as much as it used to. Summary  Contractions that occur before labor are called Braxton Hicks contractions, false labor, or practice contractions.  Braxton Hicks contractions are usually shorter, weaker, farther apart, and less regular than true labor contractions. True labor contractions usually become progressively stronger and regular, and they become more frequent.  Manage discomfort from Braxton Hicks contractions by changing position, resting in a warm bath, drinking plenty of water, or practicing deep breathing. This information is not intended to replace advice given to you by your health care provider. Make sure you discuss any questions you have with your health care  provider. Document Revised: 12/18/2016 Document Reviewed: 05/21/2016 Elsevier Patient Education  2021 Elsevier Inc.  

## 2020-05-02 NOTE — MAU Note (Signed)
Patient presents to MAU with c/o lower abd pain. Pt denies leaking of fluid or bleeding. Patient states that she has not felt baby move today.

## 2020-05-06 ENCOUNTER — Other Ambulatory Visit: Payer: Self-pay

## 2020-05-06 ENCOUNTER — Ambulatory Visit (INDEPENDENT_AMBULATORY_CARE_PROVIDER_SITE_OTHER): Payer: 59 | Admitting: Student

## 2020-05-06 VITALS — BP 116/76 | HR 95 | Wt 224.8 lb

## 2020-05-06 DIAGNOSIS — Z3A38 38 weeks gestation of pregnancy: Secondary | ICD-10-CM

## 2020-05-06 DIAGNOSIS — Z3493 Encounter for supervision of normal pregnancy, unspecified, third trimester: Secondary | ICD-10-CM

## 2020-05-06 NOTE — Patient Instructions (Signed)
New Induction of Labor Process for [employer] [facility]  In Fall 2020 [facility] changed it's process for scheduling inductions of labor to create more induction slots and to make sure patients get COVID-19 testing in advance. After you have been tested you need to quarantine so that you do not get infected after your test. You should not go anywhere after your test except necessary medical appointments.   You have been scheduled for induction of labor on 4/29. Although you may have a specific time listed on your After Visit Summary or MyChart, we cannot predict when your room will be available. Please disregard this time. A Labor and Delivery staff member will call you on the day that you are scheduled when your room is available. You will need to arrive within one hour of being called. If you do not arrive within this time frame, the next person on the list will be called in and you will move down the list. You may eat a light meal before coming to the hospital. If you go into labor, think your water has broken, experience bright red bleeding or don't feel your baby moving as much as usual before your induction, please call your Ob/Gyn's office or come to Entrance C, Maternity Assessment Unit for evaluation.  Thank you,  [facility] [employer]

## 2020-05-06 NOTE — Progress Notes (Signed)
   PRENATAL VISIT NOTE  Subjective:  Katherine Holder is a 35 y.o. G3P1011 at [redacted]w[redacted]d being seen today for ongoing prenatal care.  She is currently monitored for the following issues for this low-risk pregnancy and has Supervision of low-risk pregnancy and Hx of gestational diabetes in prior pregnancy, currently pregnant, second trimester on their problem list.  Patient reports no complaints.  Contractions: Irritability. Vag. Bleeding: None.  Movement: Present. Denies leaking of fluid.   The following portions of the patient's history were reviewed and updated as appropriate: allergies, current medications, past family history, past medical history, past social history, past surgical history and problem list.   Objective:   Vitals:   05/06/20 1605  BP: 116/76  Pulse: 95  Weight: 224 lb 12.8 oz (102 kg)    Fetal Status: Fetal Heart Rate (bpm): 147 Fundal Height: 41 cm Movement: Present     General:  Alert, oriented and cooperative. Patient is in no acute distress.  Skin: Skin is warm and dry. No rash noted.   Cardiovascular: Normal heart rate noted  Respiratory: Normal respiratory effort, no problems with respiration noted  Abdomen: Soft, gravid, appropriate for gestational age.  Pain/Pressure: Present     Pelvic: Cervical exam deferred        Extremities: Normal range of motion.  Edema: None  Mental Status: Normal mood and affect. Normal behavior. Normal judgment and thought content.   Assessment and Plan:  Pregnancy: G3P1011 at [redacted]w[redacted]d  1. [redacted] weeks gestation of pregnancy   2. Encounter for supervision of low-risk pregnancy in third trimester    -reviewed warning signs, 5-1-1 rule,  -IOL scheduled for 4-29; patient wants postdates IOL but does not want to be scheduled on  May 2 or 3 due to Ramadan. Will get patient in for antenatal testing for IOL, although space is limited for appointments. Schedule IOL around 4/29.  -orders placed; plan for pitocin as patient says she was 3 cm over  the weekend.   Term labor symptoms and general obstetric precautions including but not limited to vaginal bleeding, contractions, leaking of fluid and fetal movement were reviewed in detail with the patient. Please refer to After Visit Summary for other counseling recommendations.   Return in about 9 days (around 05/15/2020), or NST/BPP on 26, 27, 28.  Future Appointments  Date Time Provider Department Center  05/16/2020  8:15 AM [facility]    Marylene Land, [place]

## 2020-05-07 ENCOUNTER — Encounter (HOSPITAL_COMMUNITY): Payer: Self-pay | Admitting: *Deleted

## 2020-05-07 ENCOUNTER — Telehealth (HOSPITAL_COMMUNITY): Payer: Self-pay | Admitting: *Deleted

## 2020-05-07 ENCOUNTER — Encounter: Payer: Self-pay | Admitting: General Practice

## 2020-05-07 NOTE — Telephone Encounter (Signed)
Preadmission screen  

## 2020-05-12 ENCOUNTER — Other Ambulatory Visit: Payer: Self-pay

## 2020-05-14 ENCOUNTER — Encounter (HOSPITAL_COMMUNITY): Payer: Self-pay | Admitting: Student

## 2020-05-14 ENCOUNTER — Other Ambulatory Visit: Payer: Self-pay

## 2020-05-14 ENCOUNTER — Inpatient Hospital Stay (HOSPITAL_COMMUNITY)
Admission: AD | Admit: 2020-05-14 | Discharge: 2020-05-16 | DRG: 807 | Disposition: A | Discharge status: Home / Self Care | Payer: 59 | Attending: Family Medicine | Admitting: Family Medicine

## 2020-05-14 DIAGNOSIS — Z3490 Encounter for supervision of normal pregnancy, unspecified, unspecified trimester: Secondary | ICD-10-CM

## 2020-05-14 DIAGNOSIS — Z20822 Contact with and (suspected) exposure to covid-19: Secondary | ICD-10-CM | POA: Diagnosis present

## 2020-05-14 DIAGNOSIS — O09292 Supervision of pregnancy with other poor reproductive or obstetric history, second trimester: Secondary | ICD-10-CM

## 2020-05-14 DIAGNOSIS — Z8632 Personal history of gestational diabetes: Secondary | ICD-10-CM

## 2020-05-14 DIAGNOSIS — Z3A40 40 weeks gestation of pregnancy: Secondary | ICD-10-CM | POA: Diagnosis not present

## 2020-05-14 DIAGNOSIS — O480 Post-term pregnancy: Secondary | ICD-10-CM | POA: Diagnosis not present

## 2020-05-14 DIAGNOSIS — O26893 Other specified pregnancy related conditions, third trimester: Secondary | ICD-10-CM | POA: Diagnosis present

## 2020-05-14 LAB — TYPE AND SCREEN
ABO/RH(D): B POS
Antibody Screen: NEGATIVE

## 2020-05-14 LAB — CBC
HCT: 36.7 % (ref 36.0–46.0)
Hemoglobin: 12.7 g/dL (ref 12.0–15.0)
MCH: 29.8 pg (ref 26.0–34.0)
MCHC: 34.6 g/dL (ref 30.0–36.0)
MCV: 86.2 fL (ref 80.0–100.0)
Platelets: 214 10*3/uL (ref 150–400)
RBC: 4.26 MIL/uL (ref 3.87–5.11)
RDW: 14.1 % (ref 11.5–15.5)
WBC: 7.4 10*3/uL (ref 4.0–10.5)
nRBC: 0 % (ref 0.0–0.2)

## 2020-05-14 LAB — RESP PANEL BY RT-PCR (FLU A&B, COVID) ARPGX2
Influenza A by PCR: NEGATIVE
Influenza B by PCR: NEGATIVE
SARS Coronavirus 2 by RT PCR: NEGATIVE

## 2020-05-14 MED ORDER — SOD CITRATE-CITRIC ACID 500-334 MG/5ML PO SOLN: 30.0000 mL | ORAL | Status: DC | PRN

## 2020-05-14 MED ORDER — OXYTOCIN-SODIUM CHLORIDE 30-0.9 UT/500ML-% IV SOLN
2.5000 [IU]/h | INTRAVENOUS | Status: DC
  Administered 2020-05-15: 2.5 [IU]/h via INTRAVENOUS
  Filled 2020-05-14: qty 500

## 2020-05-14 MED ORDER — LIDOCAINE HCL (PF) 1 % IJ SOLN
30.0000 mL | INTRAMUSCULAR | Status: DC | PRN
  Administered 2020-05-15: 30 mL via SUBCUTANEOUS
  Filled 2020-05-14: qty 30

## 2020-05-14 MED ORDER — LACTATED RINGERS IV SOLN: 500.0000 mL | INTRAVENOUS | Status: DC | PRN

## 2020-05-14 MED ORDER — LACTATED RINGERS IV SOLN: INTRAVENOUS | Status: DC

## 2020-05-14 MED ORDER — TERBUTALINE SULFATE 1 MG/ML IJ SOLN: 0.2500 mg | Freq: Once | INTRAMUSCULAR | Status: DC | PRN

## 2020-05-14 MED ORDER — OXYCODONE-ACETAMINOPHEN 5-325 MG PO TABS: 2.0000 | ORAL_TABLET | ORAL | Status: DC | PRN

## 2020-05-14 MED ORDER — ONDANSETRON HCL 4 MG/2ML IJ SOLN: 4.0000 mg | Freq: Four times a day (QID) | INTRAMUSCULAR | Status: DC | PRN

## 2020-05-14 MED ORDER — OXYTOCIN-SODIUM CHLORIDE 30-0.9 UT/500ML-% IV SOLN: 1.0000 m[IU]/min | INTRAVENOUS | Status: DC

## 2020-05-14 MED ORDER — OXYCODONE-ACETAMINOPHEN 5-325 MG PO TABS: 1.0000 | ORAL_TABLET | ORAL | Status: DC | PRN

## 2020-05-14 MED ORDER — ACETAMINOPHEN 325 MG PO TABS: 650.0000 mg | ORAL_TABLET | ORAL | Status: DC | PRN

## 2020-05-14 MED ORDER — OXYTOCIN BOLUS FROM INFUSION
333.0000 mL | Freq: Once | INTRAVENOUS | Status: AC
  Administered 2020-05-15: 333 mL via INTRAVENOUS

## 2020-05-14 NOTE — MAU Note (Signed)
Patient brought directly back from registration. Patient reports contractions for 2-3 hours and are now every 4-5 minutes. +FM. Denies vaginal bleeding or leaking of fluid.  SVE by Ladean Raya 9cm 90% bulging bag FHR: 130's Admission orders obtained COVID swab obtained Report to LD charge nurse given Patient transported to LD accompanied by Ladean Raya, MD.

## 2020-05-14 NOTE — H&P (Signed)
OBSTETRIC ADMISSION HISTORY AND PHYSICAL  Katherine Holder is a 35 y.o. female G3P1011 with IUP at [redacted]w[redacted]d by 16 wk u/s presenting for SOL. She reports +FMs, No LOF, no VB, no blurry vision, headaches or peripheral edema, and RUQ pain.  She plans on breast feeding. She declines birth control. She received her prenatal [facility]   Dating: By 16 wk u/s --->  Estimated Date of Delivery: 05/14/20  Sono:    03/07/20@[redacted]w[redacted]d , CWD, normal anatomy, cephalic presentation, posterior placental lie, 1735g, 72% EFW   Prenatal History/Complications:  History of gHTN prior pregnancy  Past Medical History: Past Medical History:  Diagnosis Date  . Depression   . History of gestational diabetes     Past Surgical History: No past surgical history on file.  Obstetrical History: OB History    Gravida  3   Para  1   Term  1   Preterm  0   AB  1   Living  1     SAB  1   IAB  0   Ectopic  0   Multiple  0   Live Births  1           Social History Social History   Socioeconomic History  . Marital status: Married    Spouse name: Not on file  . Number of children: 1  . Years of education: Not on file  . Highest education level: Not on file  Occupational History  . Occupation: Runner, broadcasting/film/video    Comment: Paramedic  Tobacco Use  . Smoking status: Never Smoker  . Smokeless tobacco: Never Used  Vaping Use  . Vaping Use: Never used  Substance and Sexual Activity  . Alcohol use: Never  . Drug use: Never  . Sexual activity: Not Currently  Other Topics Concern  . Not on file  Social History Narrative  . Not on file   Social Determinants of Health   Financial Resource Strain: Not on file  Food Insecurity: No Food Insecurity  . Worried About Programme researcher, broadcasting/film/video in the Last Year: Never true  . Ran Out of Food in the Last Year: Never true  Transportation Needs: No Transportation Needs  . Lack of Transportation (Medical): No  . Lack of Transportation (Non-Medical): No   Physical Activity: Not on file  Stress: Not on file  Social Connections: Not on file    Family History: Family History  Problem Relation Age of Onset  . Diabetes Mother   . Hypertension Mother   . Hyperlipidemia Mother     Allergies: No Known Allergies  Medications Prior to Admission  Medication Sig Dispense Refill Last Dose  . cyclobenzaprine (FLEXERIL) 10 MG tablet Take 0.5-1 tablets (5-10 mg total) by mouth 3 (three) times daily as needed for muscle spasms. 20 tablet 0   . Prenatal Vit-Fe Fumarate-FA (PRENATAL VITAMINS) 28-0.8 MG TABS Take 1 tablet by mouth daily. 30 tablet 11      Review of Systems   All systems reviewed and negative except as stated in HPI  Temperature 97.9 F (36.6 C), temperature source Oral, last menstrual period 07/29/2019. General appearance: alert, cooperative and mild distress Lungs: normal respiratory effort Heart: regular rate and rhythm Abdomen: soft, non-tender; gravid Pelvic: as noted below Extremities: Homans sign is negative, no sign of DVT Presentation: cephalic by cervical exam NO FHT obtained given quick transfer to L&D Dilation: 9 Effacement (%): 90 Station: 0 Exam by:: Dr. Germaine Pomfret   Prenatal labs: ABO,  Rh: B/Positive/-- (11/04 1556) Antibody: Negative (11/04 1556) Rubella: 6.10 (11/04 1556) RPR: Non Reactive (02/01 0850)  HBsAg: Negative (11/04 1556)  HIV: Non Reactive (02/01 0850)  GBS: Negative/-- (03/31 1346)  2 hr Glucola passed Genetic screening  normal Anatomy US normal  Prenatal Transfer Tool  Maternal Diabetes: No Genetic Screening: Normal Maternal Ultrasounds/Referrals: Normal Fetal Ultrasounds or other Referrals:  None Maternal Substance Abuse:  No Significant Maternal Medications:  None Significant Maternal Lab Results: Group B Strep negative  No results found for this or any previous visit (from the past 24 hour(s)).  Patient Active Problem List   Diagnosis Date Noted  . Hx of gestational  diabetes in prior pregnancy, currently pregnant, second trimester 12/21/2019  . Supervision of low-risk pregnancy 11/23/2019    Assessment/Plan:  Katherine Holder is a 35 y.o. G3P1011 at [redacted]w[redacted]d here for SOL.  #Labor: Presents at 9/90/0, transferred to L&D. Continue expectant management. #Pain: PRN #FWB:  defer to L&D, no monitoring performed in MAU #ID: GBS neg #MOF: breast #MOC: declines #Circ:n/a  Alric Seton, MD  05/14/2020, 10:57 PM

## 2020-05-15 ENCOUNTER — Encounter (HOSPITAL_COMMUNITY): Payer: Self-pay | Admitting: Student

## 2020-05-15 ENCOUNTER — Other Ambulatory Visit (HOSPITAL_COMMUNITY): Payer: 59 | Attending: Obstetrics and Gynecology

## 2020-05-15 ENCOUNTER — Inpatient Hospital Stay (HOSPITAL_COMMUNITY): Payer: 59 | Admitting: Anesthesiology

## 2020-05-15 DIAGNOSIS — O480 Post-term pregnancy: Secondary | ICD-10-CM

## 2020-05-15 DIAGNOSIS — Z3A40 40 weeks gestation of pregnancy: Secondary | ICD-10-CM

## 2020-05-15 LAB — RPR: RPR Ser Ql: NONREACTIVE

## 2020-05-15 MED ORDER — DIPHENHYDRAMINE HCL 25 MG PO CAPS: 25.0000 mg | ORAL_CAPSULE | Freq: Four times a day (QID) | ORAL | Status: DC | PRN

## 2020-05-15 MED ORDER — TRANEXAMIC ACID-NACL 1000-0.7 MG/100ML-% IV SOLN
INTRAVENOUS | Status: AC
  Administered 2020-05-15: 1000 mg
  Filled 2020-05-15: qty 100

## 2020-05-15 MED ORDER — LIDOCAINE HCL (PF) 1 % IJ SOLN
INTRAMUSCULAR | Status: DC | PRN
  Administered 2020-05-15: 5 mL via EPIDURAL

## 2020-05-15 MED ORDER — ACETAMINOPHEN 325 MG PO TABS: 650.0000 mg | ORAL_TABLET | ORAL | Status: DC | PRN

## 2020-05-15 MED ORDER — TETANUS-DIPHTH-ACELL PERTUSSIS 5-2.5-18.5 LF-MCG/0.5 IM SUSY: 0.5000 mL | PREFILLED_SYRINGE | Freq: Once | INTRAMUSCULAR | Status: DC

## 2020-05-15 MED ORDER — ONDANSETRON HCL 4 MG PO TABS: 4.0000 mg | ORAL_TABLET | ORAL | Status: DC | PRN

## 2020-05-15 MED ORDER — SIMETHICONE 80 MG PO CHEW: 80.0000 mg | CHEWABLE_TABLET | ORAL | Status: DC | PRN

## 2020-05-15 MED ORDER — DOCUSATE SODIUM 100 MG PO CAPS
100.0000 mg | ORAL_CAPSULE | Freq: Two times a day (BID) | ORAL | Status: DC
  Administered 2020-05-15 – 2020-05-16 (×2): 100 mg via ORAL
  Filled 2020-05-15 (×2): qty 1

## 2020-05-15 MED ORDER — LACTATED RINGERS IV SOLN: 500.0000 mL | Freq: Once | INTRAVENOUS | Status: DC

## 2020-05-15 MED ORDER — IBUPROFEN 600 MG PO TABS
600.0000 mg | ORAL_TABLET | Freq: Four times a day (QID) | ORAL | Status: DC
  Administered 2020-05-15 – 2020-05-16 (×5): 600 mg via ORAL
  Filled 2020-05-15 (×5): qty 1

## 2020-05-15 MED ORDER — BENZOCAINE-MENTHOL 20-0.5 % EX AERO
1.0000 "application " | INHALATION_SPRAY | CUTANEOUS | Status: DC | PRN
  Filled 2020-05-15: qty 56

## 2020-05-15 MED ORDER — ONDANSETRON HCL 4 MG/2ML IJ SOLN: 4.0000 mg | INTRAMUSCULAR | Status: DC | PRN

## 2020-05-15 MED ORDER — PHENYLEPHRINE 40 MCG/ML (10ML) SYRINGE FOR IV PUSH (FOR BLOOD PRESSURE SUPPORT)
80.0000 ug | PREFILLED_SYRINGE | INTRAVENOUS | Status: DC | PRN
  Filled 2020-05-15: qty 10

## 2020-05-15 MED ORDER — DIPHENHYDRAMINE HCL 50 MG/ML IJ SOLN
12.5000 mg | INTRAMUSCULAR | Status: DC | PRN
Start: 2020-05-15 — End: 2020-05-15

## 2020-05-15 MED ORDER — FENTANYL-BUPIVACAINE-NACL 0.5-0.125-0.9 MG/250ML-% EP SOLN
EPIDURAL | Status: DC | PRN
  Administered 2020-05-15: 12 mL/h via EPIDURAL

## 2020-05-15 MED ORDER — FENTANYL-BUPIVACAINE-NACL 0.5-0.125-0.9 MG/250ML-% EP SOLN
12.0000 mL/h | EPIDURAL | Status: DC | PRN
  Filled 2020-05-15: qty 250

## 2020-05-15 MED ORDER — SENNOSIDES-DOCUSATE SODIUM 8.6-50 MG PO TABS
2.0000 | ORAL_TABLET | ORAL | Status: DC
  Filled 2020-05-15: qty 2

## 2020-05-15 MED ORDER — WITCH HAZEL-GLYCERIN EX PADS
1.0000 | MEDICATED_PAD | CUTANEOUS | Status: DC | PRN
Start: 2020-05-15 — End: 2020-05-16

## 2020-05-15 MED ORDER — PHENYLEPHRINE 40 MCG/ML (10ML) SYRINGE FOR IV PUSH (FOR BLOOD PRESSURE SUPPORT): 80.0000 ug | PREFILLED_SYRINGE | INTRAVENOUS | Status: DC | PRN

## 2020-05-15 MED ORDER — COCONUT OIL OIL: 1.0000 "application " | TOPICAL_OIL | Status: DC | PRN

## 2020-05-15 MED ORDER — TRANEXAMIC ACID-NACL 1000-0.7 MG/100ML-% IV SOLN: 1000.0000 mg | INTRAVENOUS | Status: DC

## 2020-05-15 MED ORDER — EPHEDRINE 5 MG/ML INJ: 10.0000 mg | INTRAVENOUS | Status: DC | PRN

## 2020-05-15 MED ORDER — PRENATAL MULTIVITAMIN CH
1.0000 | ORAL_TABLET | Freq: Every day | ORAL | Status: DC
  Administered 2020-05-16: 1 via ORAL
  Filled 2020-05-15: qty 1

## 2020-05-15 MED ORDER — DIBUCAINE (PERIANAL) 1 % EX OINT: 1.0000 "application " | TOPICAL_OINTMENT | CUTANEOUS | Status: DC | PRN

## 2020-05-15 NOTE — Lactation Note (Signed)
This note was copied from a baby's chart. Lactation Consultation Note  Patient Name: [name] [id number] Date: 05/15/2020 Reason for consult: Follow-up assessment;Term Age:34 hours  P2 mother whose infant is now 39 hours old.  This is a term baby at 40+1 weeks.  Mother breast fed her first child (now 40 years old) for 1 1/2 months.  She had difficulty due to pain with latching.  Mother had baby latched at the breast when I arrived.  Baby was not actively sucking and was on the nipple only.  She was also clothed and swaddled.  Spent time educating mother on basic breast feeding concepts.  Offered to assist, however, when releasing baby from the breast she was asleep.  Encouraged mother to call her RN/LC for assistance with the next feeding.  Mother appreciative.  Mother very tired and I suggested placing baby in the bassinet so she could rest.  Mother was happy to do this; reviewed safe sleep.    Mother will feed on cue or at least 8-12 times/24 hours.  Per social work note, father is not currently living in the [place].  No other support person present at this time.  RN updated.   Maternal Data Has patient been taught Hand Expression?: Yes Does the patient have breastfeeding experience prior to this delivery?: Yes How long did the patient breastfeed?: 1 1/2 months  Feeding Mother's Current Feeding Choice: Breast Milk  LATCH Score Latch: Too sleepy or reluctant, no latch achieved, no sucking elicited.  Audible Swallowing: None  Type of Nipple: Everted at rest and after stimulation  Comfort (Breast/Nipple): Soft / non-tender  Hold (Positioning): Assistance needed to correctly position infant at breast and maintain latch.  LATCH Score: 5   Lactation Tools Discussed/Used    Interventions Interventions: Breast feeding basics reviewed;Education  Discharge WIC Program: No  Consult Status Consult Status: Follow-up Date: 05/16/20 Follow-up type:  In-patient    Dora Sims 05/15/2020, 11:33 AM

## 2020-05-15 NOTE — Anesthesia Preprocedure Evaluation (Signed)
Anesthesia Evaluation  Patient identified by MRN, date of birth, ID band Patient awake    Reviewed: Allergy & Precautions, NPO status , Patient's Chart, lab work & pertinent test results  Airway Mallampati: II  TM Distance: >3 FB Neck ROM: Full    Dental no notable dental hx. (+) Teeth Intact, Dental Advisory Given   Pulmonary neg pulmonary ROS,    Pulmonary exam normal breath sounds clear to auscultation       Cardiovascular Exercise Tolerance: Good Normal cardiovascular exam Rhythm:Regular Rate:Normal     Neuro/Psych negative neurological ROS     GI/Hepatic negative GI ROS, Neg liver ROS,   Endo/Other  negative endocrine ROS  Renal/GU negative Renal ROS     Musculoskeletal   Abdominal (+) + obese,   Peds  Hematology Lab Results      Component                Value               Date                      WBC                      7.4                 05/14/2020                HGB                      12.7                05/14/2020                HCT                      36.7                05/14/2020                MCV                      86.2                05/14/2020                PLT                      214                 05/14/2020              Anesthesia Other Findings   Reproductive/Obstetrics (+) Pregnancy                             Anesthesia Physical Anesthesia Plan  ASA: III  Anesthesia Plan: Epidural   Post-op Pain Management:    Induction:   PONV Risk Score and Plan:   Airway Management Planned:   Additional Equipment:   Intra-op Plan:   Post-operative Plan:   Informed Consent: I have reviewed the patients History and Physical, chart, labs and discussed the procedure including the risks, benefits and alternatives for the proposed anesthesia with the patient or authorized representative who has indicated his/her understanding and acceptance.       Plan  Discussed with:  Anesthesia Plan Comments: (40.1 w G3P1 for LEA)        Anesthesia Quick Evaluation

## 2020-05-15 NOTE — Discharge Summary (Signed)
Postpartum Discharge Summary       Patient Name: Katherine Holder DOB: 09-08-1985 MRN: [id number]  Date of admission: 05/14/2020 Delivery date:05/15/2020  Delivering provider: Janet Berlin  Date of discharge: 05/16/2020  Admitting diagnosis: Indication for care in labor and delivery, antepartum [O75.9] Intrauterine pregnancy: [redacted]w[redacted]d    Secondary diagnosis:  Active Problems:   Supervision of low-risk pregnancy   Hx of gestational diabetes in prior pregnancy, currently pregnant, second trimester   Indication for care in labor and delivery, antepartum   Vaginal delivery  Additional problems: none    Discharge diagnosis: Term Pregnancy Delivered                                              Post partum procedures:none Augmentation: AROM Complications: None  Hospital course: Onset of Labor With Vaginal Delivery      35y.o. yo G3P1011 at 458w1das admitted in Active Labor on 05/14/2020. Patient had an uncomplicated labor course as follows:  Membrane Rupture Time/Date: 11:26 PM ,05/14/2020   Delivery Method:Vaginal, Spontaneous  Episiotomy: None  Lacerations:  2nd degree  Patient had an uncomplicated postpartum course.  She is ambulating, tolerating a regular diet, passing flatus, and urinating well. Patient is discharged home in stable condition on 05/16/20.  Newborn Data: Birth date:05/15/2020  Birth time:3:08 AM  Gender:Female  Living status:Living  Apgars:8 ,9  Weight:3586 g   Magnesium Sulfate received: No BMZ received: No Rhophylac:N/A MMR:N/A T-DaP:declines Flu: No Transfusion:No  Physical exam  Vitals:   05/15/20 1430 05/15/20 1830 05/15/20 2059 05/16/20 0540  BP: 128/70 125/74 117/80 116/69  Pulse: 88 90 89 88  Resp: 17 17 16 18   Temp: 98.2 F (36.8 C) 98 F (36.7 C) 98.6 F (37 C) 97.9 F (36.6 C)  TempSrc:   Oral Oral  SpO2: 100% 99% 99% 100%  Weight:      Height:       General: alert, cooperative and no distress Lochia: appropriate Uterine  Fundus: firm Incision: N/A DVT Evaluation: No evidence of DVT seen on physical exam. Labs: Lab Results  Component Value Date   WBC 7.4 05/14/2020   HGB 12.7 05/14/2020   HCT 36.7 05/14/2020   MCV 86.2 05/14/2020   PLT 214 05/14/2020   No flowsheet data found. Edinburgh Score: Edinburgh Postnatal Depression Scale Screening Tool 05/15/2020  I have been able to laugh and see the funny side of things. (No Data)     After visit meds:  Allergies as of 05/16/2020   No Known Allergies     Medication List    TAKE these medications   acetaminophen 325 MG tablet Commonly known as: Tylenol Take 2 tablets (650 mg total) by mouth every 4 (four) hours as needed (for pain scale < 4).   coconut oil Oil Apply 1 application topically as needed.   cyclobenzaprine 10 MG tablet Commonly known as: FLEXERIL Take 0.5-1 tablets (5-10 mg total) by mouth 3 (three) times daily as needed for muscle spasms.   docusate sodium 100 MG capsule Commonly known as: COLACE Take 1 capsule (100 mg total) by mouth daily as needed for mild constipation.   ibuprofen 600 MG tablet Commonly known as: ADVIL Take 1 tablet (600 mg total) by mouth every 6 (six) hours.   Prenatal Vitamins 28-0.8 MG Tabs Take 1 tablet by mouth daily.  Discharge home in stable condition Infant Feeding: Breast Infant Disposition:home with mother Discharge instruction: per After Visit Summary and Postpartum booklet. Activity: Advance as tolerated. Pelvic rest for 6 weeks.  Diet: routine diet Future Appointments: No future appointments. Follow up Visit:   Please schedule this patient for a In person postpartum visit in 6 weeks with the following provider: Any provider. Additional Postpartum F/U:none  Low risk pregnancy complicated by: uncomplicated Delivery mode:  Vaginal, Spontaneous  Anticipated Birth Control:  Unsure   05/16/2020 Janet Berlin, MD

## 2020-05-15 NOTE — Anesthesia Procedure Notes (Addendum)
Epidural Patient location during procedure: OB Start time: 05/15/2020 1:49 AM End time: 05/15/2020 2:06 AM  Staffing Anesthesiologist: Trevor Iha, MD Performed: anesthesiologist   Preanesthetic Checklist Completed: patient identified, IV checked, site marked, risks and benefits discussed, surgical consent, monitors and equipment checked, pre-op evaluation and timeout performed  Epidural Patient position: sitting Prep: DuraPrep and site prepped and draped Patient monitoring: continuous pulse ox and blood pressure Approach: midline Location: L3-L4 Injection technique: LOR air  Needle:  Needle type: Tuohy  Needle gauge: 17 G Needle length: 9 cm and 9 Needle insertion depth: 8 cm Catheter type: closed end flexible Catheter size: 19 Gauge Catheter at skin depth: 14 cm Test dose: negative  Assessment Events: blood not aspirated, injection not painful, no injection resistance, no paresthesia and negative IV test  Additional Notes Patient identified. Risks/Benefits/Options discussed with patient including but not limited to bleeding, infection, nerve damage, paralysis, failed block, incomplete pain control, headache, blood pressure changes, nausea, vomiting, reactions to medication both or allergic, itching and postpartum back pain. Confirmed with bedside nurse the patient's most recent platelet count. Confirmed with patient that they are not currently taking any anticoagulation, have any bleeding history or any family history of bleeding disorders. Patient expressed understanding and wished to proceed. All questions were answered. Sterile technique was used throughout the entire procedure. Please see nursing notes for vital signs. Test dose was given through epidural needle and negative prior to continuing to dose epidural or start infusion. Warning signs of high block given to the patient including shortness of breath, tingling/numbness in hands, complete motor block, or any  concerning symptoms with instructions to call for help. Patient was given instructions on fall risk and not to get out of bed. All questions and concerns addressed with instructions to call with any issues. 1 Attempt (S) . Patient tolerated procedure well.

## 2020-05-15 NOTE — Lactation Note (Signed)
This note was copied from a baby's chart. Lactation Consultation Note Baby 1 hr old. Latched baby to breast. Mom has soft breast baby kept falling into breast, repositioned baby several time for breathing. Mom BF her now 35 yr old for 1 1/2 months. Mom stated it was painful. Mom denies painful latch at this time. Baby feel cool to touch. Covered baby w/blanket and mom's sheet. RN is going to assess baby shortly. Will f/u w/mom on MBU.  Patient Name: Katherine Holder FIEPP'I Date: 05/15/2020 Reason for consult: L&D Initial assessment;Term Age:62 hours  Maternal Data    Feeding    LATCH Score Latch: Grasps breast easily, tongue down, lips flanged, rhythmical sucking.  Audible Swallowing: None  Type of Nipple: Everted at rest and after stimulation  Comfort (Breast/Nipple): Soft / non-tender  Hold (Positioning): Assistance needed to correctly position infant at breast and maintain latch.  LATCH Score: 7   Lactation Tools Discussed/Used    Interventions Interventions: Breast feeding basics reviewed;Support pillows;Assisted with latch;Skin to skin;Breast compression;Adjust position  Discharge [facility] Program: No  Consult Status Consult Status: Follow-up Date: 05/15/20 Follow-up type: In-patient    Charyl Dancer 05/15/2020, 4:55 AM

## 2020-05-15 NOTE — Anesthesia Postprocedure Evaluation (Signed)
Anesthesia Post Note  Patient: Nutritional therapist  Procedure(s) Performed: AN AD HOC LABOR EPIDURAL     Patient location during evaluation: Mother Baby Anesthesia Type: Epidural Level of consciousness: awake, awake and alert and oriented Pain management: pain level controlled Vital Signs Assessment: post-procedure vital signs reviewed and stable Respiratory status: spontaneous breathing, nonlabored ventilation and respiratory function stable Cardiovascular status: stable Postop Assessment: no headache, patient able to bend at knees, no apparent nausea or vomiting, no backache, adequate PO intake and able to ambulate Anesthetic complications: no   No complications documented.  Last Vitals:  Vitals:   05/15/20 1030 05/15/20 1430  BP: 122/78 128/70  Pulse: 79 88  Resp: 17 17  Temp: 36.8 C 36.8 C  SpO2: 99% 100%    Last Pain:  Vitals:   05/15/20 1430  TempSrc:   PainSc: 0-No pain   Pain Goal:                   Tavarious Freel

## 2020-05-15 NOTE — Lactation Note (Signed)
This note was copied from a baby's chart. Lactation Consultation Note  Patient Name: Katherine Holder Date: 05/15/2020 Reason for consult: Follow-up assessment;Mother's request;Maternal endocrine disorder;Term Age:34 hours  Mom stated infant just had a feeding. Mom requested a manual pump. LC reviewed assembly, usage and storage with Mom.  Mom to pre pump 5-10 minutes before latching. Mom to offer EBM via spoon or finger if she is unable to get infant to latch.  Mom to call RN or [facility] for assistance with next latch.  Mom's nipples are erect with no signs of compression or trauma.   All questions answered at the end of the visit.  Maternal Data    Feeding Mother's Current Feeding Choice: Breast Milk  LATCH Score                    Lactation Tools Discussed/Used Tools: Pump;Flanges Flange Size: 24 Breast pump type: Manual Pump Education: Setup, frequency, and cleaning;Milk Storage Reason for Pumping: increase stimulation Pumping frequency: pre pump 5-10 minutes before latching.  Interventions Interventions: Breast feeding basics reviewed;Education;Hand pump;Pre-pump if needed  Discharge    Consult Status Consult Status: Follow-up Follow-up type: In-patient    Dalicia Kisner  Nicholson-Springer 05/15/2020, 8:08 PM

## 2020-05-15 NOTE — Social Work (Signed)
CSW received consult for hx of Depression. CSW met with MOB to offer support and complete assessment.    CSW met with the MOB at bedside. CSW introduced role and congratulated MOB. CSW observed infant sleeping in bassinet and MOB on phone call. CSW offered to return at different time, but MOB ended call. MOB insisted CSW continue the visit. CSW explain the reason for the visit. CSW asked MOB how she has feels since giving birth. MOB reports she feels good. CSW inquired about MOB pregnancy. MOB reports she had a good pregnancy, no concerns.   CSW inquired about MOB history of depression. MOB acknowledges she does have depression. MOB reports she was diagnosed about 4 years ago.  MOB reports her depression is situational. MOB when the situation is over, she feels the depression symptoms lessen. MOB reports she was prescribed medication however she cannot recall the prescription. MOB reports at the time she had a counselor and felt talk therapy was helpful. MOB reports she just moved from [place] to [place] for employment last October, so she is adjusting to the area. CSW provided education regarding the baby blues period vs. perinatal mood disorders, discussed treatment and gave resources for mental health follow up. MOB receptive to the resources.   CSW inquired about MOB supports. MOB acknowledges her mother as her primary support. MOB reports she has extended family that lives in [place] as well. MOB report the FOB is living abroad. MOB is looking forward to FOB meeting the baby next year. MOB reports FOB is very involved cares for his family. MOB looks forward to the  baby meeting FOB next year.   CSW assessed MOB for safety. MOB denies thoughts of harm to self and others.  CSW inquired if MOB experienced postpartum during her last birth. MOB reports she experienced postpartum. MOB report she could not stop cleaning. MOB reports she felt frustrated if there were little things out place. MOB reports  she notified her counselor and they informed her that the postpartum symptoms were normal. CSW recommended MOB complete a self-evaluation during the postpartum time period using the New Mom Checklist from Postpartum Progress and encouraged MOB to contact her OBGYN office if symptoms are noted. MOB receptive to the check list.   CSW provided review of Sudden Infant Death Syndrome (SIDS) precautions. MOB very knowledgeable of SIDS. 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 MOB reports the infant will sleep in a crib. MOB reports she chose a Pediatrician at [facility]. CSW assessed MOB for further needs. MOB reports no other need.   CSW identifies no further need for intervention and no barriers to discharge at this time.  Kathrin Greathouse, MSW, LCSW [facility] [place] Worker  [phone] 05/15/2020  11:07 AM

## 2020-05-15 NOTE — Progress Notes (Signed)
Labor Progress Note Katherine Holder is a 35 y.o. G3P1011 at [redacted]w[redacted]d presented for labor.   S: Feeling frustrated that she hasn't made change. Says she is exhausted.   O:  BP (!) 130/92   Pulse 99   Temp 97.9 F (36.6 C) (Oral)   Resp 18   Ht 5\' 6"  (1.676 m)   Wt 103 kg   LMP 07/29/2019   BMI 36.64 kg/m  EFM: baseline 135/mod variability/pos accels/early decels intermittently   CVE: Dilation: 8 Effacement (%): 90 Station: 0 Presentation: Vertex Exam by:: Dr. [email or web address]   A&P: 35 y.o. 32 [redacted]w[redacted]d here in active labor  #Labor: after arrival to L&D, AROM for clear fluid at 2330, felt to be more like 7-8cm. Patient has not made cervical change since AROM, feel that baby is OP on exam. Patient now desires epidural. Will get epidural and continue repositioning. Will place IUPC after epidural in place.  #Pain: per patient request #FWB: Cat I  #GBS negative    2331, MD 2:02 AM

## 2020-05-16 ENCOUNTER — Encounter: Payer: 59 | Admitting: Nurse Practitioner

## 2020-05-16 ENCOUNTER — Other Ambulatory Visit: Payer: 59

## 2020-05-16 MED ORDER — DOCUSATE SODIUM 100 MG PO CAPS: 100.0000 mg | ORAL_CAPSULE | Freq: Every day | ORAL | 0 refills | Status: DC | PRN

## 2020-05-16 MED ORDER — ACETAMINOPHEN 325 MG PO TABS: 650.0000 mg | ORAL_TABLET | ORAL | 0 refills | Status: DC | PRN

## 2020-05-16 MED ORDER — COCONUT OIL OIL: 1.0000 "application " | TOPICAL_OIL | 0 refills | Status: DC | PRN

## 2020-05-16 MED ORDER — IBUPROFEN 600 MG PO TABS: 600.0000 mg | ORAL_TABLET | Freq: Four times a day (QID) | ORAL | 0 refills | Status: DC

## 2020-05-16 NOTE — Lactation Note (Signed)
This note was copied from a baby's chart. Lactation Consultation Note  Patient Name: Katherine Holder OVZCH'Y Date: 05/16/2020 Reason for consult: Follow-up assessment Age:34 hours   Mother reports that she doesn't have enough milk yet so she is supplementing infant with formula. Mother reports that she is breastfeeding infant first and then offering formula.   Infant is 35 hours old. Encouraged mother and educated mother about milk coming to volume and the infants need for the colostrum.   Mother has a hand pump and a DEBP at home.   Discussed treatment and prevention of engorgement.   Mother to continue to cue base feed infant and feed at least 8-12 times or more in 24 hours and advised to allow for cluster feeding infant as needed.  Mother to continue to due STS. Mother is aware of available LC services at [facility], BFSG'S, OP Dept, and phone # for questions or concerns about breastfeeding.  Mother receptive to all teaching and plan of care.     Maternal Data    Feeding Mother's Current Feeding Choice: Breast Milk  LATCH Score                    Lactation Tools Discussed/Used    Interventions Interventions: Hand pump  Discharge Discharge Education: Engorgement and breast care;Warning signs for feeding baby;Outpatient recommendation  Consult Status Consult Status: Complete    Michel Bickers 05/16/2020, 12:46 PM

## 2020-05-16 NOTE — Progress Notes (Signed)
Pt requesting formula. Reviewed supplementation handout and the benefits of breastfeeding vs risk of formula feeding with mother. Mother breast fed her 34 yr old for one month before switching to formula. Mother understands the risks. Mother provided with formula and nipples for supplementation.

## 2020-05-16 NOTE — Lactation Note (Signed)
This note was copied from a baby's chart. Lactation Consultation Note Baby fussy at the breast. Acts like she's very hungry. Suckling vigorously. Making clicking noise. Obviously loosing suction. Assessed suck. Baby clamping, feeling gums and tongue thrusting. Baby suckling hard and fussing at the breast. Not getting satisfied. Encouraged mom to hold breast. LC placed dry cloth under breast for extra support. Hand expression w/colostrum noted.  Baby may have a short frenulum. Baby has had several stools but no voids as of yet per mom.  Patient Name: Katherine Holder Date: 05/16/2020 Reason for consult: Follow-up assessment;Term Age:27 hours  Maternal Data Has patient been taught Hand Expression?: Yes  Feeding    LATCH Score Latch: Grasps breast easily, tongue down, lips flanged, rhythmical sucking.  Audible Swallowing: A few with stimulation  Type of Nipple: Everted at rest and after stimulation (short shaft compressible)  Comfort (Breast/Nipple): Soft / non-tender  Hold (Positioning): Assistance needed to correctly position infant at breast and maintain latch.  LATCH Score: 8   Lactation Tools Discussed/Used    Interventions Interventions: Breast feeding basics reviewed;Support pillows;Assisted with latch;Position options;Skin to skin;Expressed milk;Breast massage;Hand express;Pre-pump if needed;Adjust position;Breast compression  Discharge    Consult Status Consult Status: Follow-up Date: 05/16/20 Follow-up type: In-patient    Charyl Dancer 05/16/2020, 12:54 AM

## 2020-05-16 NOTE — Discharge Instructions (Signed)

## 2020-05-17 ENCOUNTER — Inpatient Hospital Stay (HOSPITAL_COMMUNITY): Admission: AD | Admit: 2020-05-17 | Payer: 59 | Source: Home / Self Care | Admitting: Family Medicine

## 2020-05-17 ENCOUNTER — Inpatient Hospital Stay (HOSPITAL_COMMUNITY): Payer: 59

## 2020-05-21 ENCOUNTER — Other Ambulatory Visit: Payer: Self-pay | Admitting: Obstetrics and Gynecology

## 2020-05-25 ENCOUNTER — Other Ambulatory Visit: Payer: Self-pay | Admitting: Obstetrics and Gynecology

## 2020-06-05 ENCOUNTER — Other Ambulatory Visit: Payer: Self-pay | Admitting: Obstetrics and Gynecology

## 2020-06-18 ENCOUNTER — Ambulatory Visit: Payer: 59 | Admitting: Obstetrics and Gynecology

## 2020-06-21 ENCOUNTER — Encounter: Payer: Self-pay | Admitting: Certified Nurse Midwife

## 2020-06-21 ENCOUNTER — Other Ambulatory Visit (HOSPITAL_COMMUNITY)
Admission: RE | Admit: 2020-06-21 | Discharge: 2020-06-21 | Disposition: A | Discharge status: Home / Self Care | Payer: 59 | Source: Ambulatory Visit | Attending: Obstetrics and Gynecology | Admitting: Obstetrics and Gynecology

## 2020-06-21 ENCOUNTER — Ambulatory Visit (INDEPENDENT_AMBULATORY_CARE_PROVIDER_SITE_OTHER): Payer: 59 | Admitting: Certified Nurse Midwife

## 2020-06-21 ENCOUNTER — Other Ambulatory Visit: Payer: Self-pay

## 2020-06-21 DIAGNOSIS — Z124 Encounter for screening for malignant neoplasm of cervix: Secondary | ICD-10-CM | POA: Insufficient documentation

## 2020-06-21 NOTE — Progress Notes (Signed)
Post Partum Visit Note  Katherine Holder is a 35 y.o. G75P2012 female who presents for a postpartum visit. She is 5 weeks postpartum following a normal spontaneous vaginal delivery.  I have fully reviewed the prenatal and intrapartum course. The delivery was at 40-1 gestational weeks.  Anesthesia: epidural. Postpartum course has been very good. Baby is doing well. Baby is feeding by both breast and bottle - Trula Ore. Bleeding no bleeding. Bowel function is normal. Bladder function is normal. Patient is not sexually active. Contraception method is none (FOB lives in [place], she is not sexually active outside her marriage). Postpartum depression screening: negative.   The pregnancy intention screening data noted above was reviewed. Potential methods of contraception were discussed. The patient elected to proceed with Abstinence.    Edinburgh Postnatal Depression Scale - 06/21/20 0941      Edinburgh Postnatal Depression Scale:  In the Past 7 Days   I have been able to laugh and see the funny side of things. 0    I have looked forward with enjoyment to things. 0    I have blamed myself unnecessarily when things went wrong. 0    I have been anxious or worried for no good reason. 0    I have felt scared or panicky for no good reason. 0    Things have been getting on top of me. 0    I have been so unhappy that I have had difficulty sleeping. 0    I have felt sad or miserable. 0    I have been so unhappy that I have been crying. 0    The thought of harming myself has occurred to me. 0    Edinburgh Postnatal Depression Scale Total 0           Health Maintenance Due  Topic Date Due  . PAP SMEAR-Modifier  05/05/2019  . COVID-19 Vaccine (3 - Booster for Moderna series) 11/08/2019    The following portions of the patient's history were reviewed and updated as appropriate: allergies, current medications, past family history, past medical history, past social history, past surgical  history and problem list.  Review of Systems Pertinent items noted in HPI and remainder of comprehensive ROS otherwise negative.  Objective:  BP 116/74   Pulse 74   Wt 208 lb 9.6 oz (94.6 kg)   LMP 07/29/2019   Breastfeeding Yes   BMI 33.67 kg/m    General:  alert, cooperative, appears stated age and no distress   Breasts:  normal  Lungs: normal effort  Heart:  regular rate and rhythm  Abdomen: soft, non-tender   Wound N/A  GU exam:  Loose stitches noted and removed, pt expressed relief of pulling sensation.        Assessment:   Normal postpartum exam.   Plan:   Essential components of care per ACOG recommendations:  1.  Mood and well being: Patient with negative depression screening today. Reviewed local resources for support.  - Patient tobacco use? No.   - hx of drug use? No.    2. Infant care and feeding:  -Patient currently breastmilk feeding? Yes. Reviewed importance of draining breast regularly to support lactation.  -Social determinants of health (SDOH) reviewed in EPIC. No concerns  3. Sexuality, contraception and birth spacing - Patient does not want a pregnancy in the next year.  Desired family size is currently unknown. - Reviewed forms of contraception in tiered fashion. Patient desired abstinence today.   -  Discussed birth spacing of 18 months  4. Sleep and fatigue -Encouraged family/partner/community support of 4 hrs of uninterrupted sleep to help with mood and fatigue  5. Physical Recovery  - Discussed patients delivery and complications. She describes her labor as good. - Patient had a Vaginal, no problems at delivery. Patient had a 2nd degree laceration. Perineal healing reviewed. Patient expressed understanding - Patient has urinary incontinence? No. - Patient is safe to resume physical and sexual activity  6.  Health Maintenance - HM due items addressed Yes - Last pap smear unknown. Pap smear done at today's visit.  -Breast Cancer screening  indicated? No.   7. Chronic Disease/Pregnancy Condition follow up: None - PCP follow up as needed  Bernerd Limbo, CNM [facility] [employer], [facility]

## 2020-06-25 LAB — CYTOLOGY - PAP
Comment: NEGATIVE
Diagnosis: NEGATIVE
High risk HPV: NEGATIVE

## 2020-12-03 ENCOUNTER — Other Ambulatory Visit: Payer: Self-pay | Admitting: Certified Nurse Midwife

## 2020-12-03 DIAGNOSIS — Z3009 Encounter for other general counseling and advice on contraception: Secondary | ICD-10-CM

## 2020-12-03 MED ORDER — LEVONORGESTREL 1.5 MG PO TABS: 1.5000 mg | ORAL_TABLET | Freq: Once | ORAL | 0 refills | Status: AC

## 2020-12-03 MED ORDER — NORGESTIMATE-ETH ESTRADIOL 0.25-35 MG-MCG PO TABS: 1.0000 | ORAL_TABLET | Freq: Every day | ORAL | 11 refills | Status: DC

## 2020-12-03 NOTE — Progress Notes (Signed)
Discussed use of hormonal birth control only when [place] her [place]. Called in for hormonal birth control and also desires Plan B as a back up. Advised to use a barrier method as well and prescriptions sent to pharmacy. Edd Arbour, CNM, MSN, IBCLC Certified Nurse Midwife, [facility]

## 2021-09-24 ENCOUNTER — Ambulatory Visit (INDEPENDENT_AMBULATORY_CARE_PROVIDER_SITE_OTHER): Payer: Self-pay

## 2021-09-24 DIAGNOSIS — Z3490 Encounter for supervision of normal pregnancy, unspecified, unspecified trimester: Secondary | ICD-10-CM

## 2021-09-24 MED ORDER — PRENATAL PLUS VITAMIN/MINERAL 27-1 MG PO TABS: 1.0000 | ORAL_TABLET | Freq: Every day | ORAL | 11 refills | Status: DC

## 2021-09-24 NOTE — Progress Notes (Signed)
Possible Pregnancy  Here today to leave urine specimen for pregnancy confirmation. UPT in office today is positive. Pt reports first positive home UPT on 09/15/2021. Reviewed dating with patient:   LMP: 08/08/2021 EDD: 05/15/2022 6w 5d today  OB history reviewed. Patient G4P2 with a history of 2 vaginal deliveries, history of GDM. Reviewed medications and allergies with patient; list of medications safe to take during pregnancy given.  Recommended pt begin prenatal vitamin and schedule prenatal care.  Janeece Agee, RN 09/24/2021  4:45 PM

## 2021-09-25 LAB — POCT PREGNANCY, URINE: Preg Test, Ur: POSITIVE — AB

## 2021-10-16 ENCOUNTER — Encounter (HOSPITAL_COMMUNITY): Payer: Self-pay

## 2021-10-16 ENCOUNTER — Inpatient Hospital Stay (HOSPITAL_COMMUNITY)
Admission: AD | Admit: 2021-10-16 | Discharge: 2021-10-16 | Disposition: A | Discharge status: Home / Self Care | Payer: Commercial Managed Care - HMO | Attending: Obstetrics and Gynecology | Admitting: Obstetrics and Gynecology

## 2021-10-16 ENCOUNTER — Inpatient Hospital Stay (HOSPITAL_COMMUNITY): Payer: Commercial Managed Care - HMO

## 2021-10-16 DIAGNOSIS — Z3A09 9 weeks gestation of pregnancy: Secondary | ICD-10-CM | POA: Diagnosis not present

## 2021-10-16 DIAGNOSIS — M549 Dorsalgia, unspecified: Secondary | ICD-10-CM | POA: Diagnosis not present

## 2021-10-16 DIAGNOSIS — R1030 Lower abdominal pain, unspecified: Secondary | ICD-10-CM | POA: Diagnosis not present

## 2021-10-16 DIAGNOSIS — O26899 Other specified pregnancy related conditions, unspecified trimester: Secondary | ICD-10-CM

## 2021-10-16 DIAGNOSIS — O034 Incomplete spontaneous abortion without complication: Secondary | ICD-10-CM | POA: Diagnosis not present

## 2021-10-16 DIAGNOSIS — O209 Hemorrhage in early pregnancy, unspecified: Secondary | ICD-10-CM

## 2021-10-16 DIAGNOSIS — O26891 Other specified pregnancy related conditions, first trimester: Secondary | ICD-10-CM | POA: Diagnosis present

## 2021-10-16 LAB — URINALYSIS, ROUTINE W REFLEX MICROSCOPIC
Bacteria, UA: NONE SEEN
Bilirubin Urine: NEGATIVE
Glucose, UA: NEGATIVE mg/dL
Ketones, ur: NEGATIVE mg/dL
Leukocytes,Ua: NEGATIVE
Nitrite: NEGATIVE
Protein, ur: NEGATIVE mg/dL
RBC / HPF: >50 RBC/hpf — ABNORMAL HIGH (ref 0–5)
Specific Gravity, Urine: 1.02 (ref 1.005–1.030)
pH: 6 (ref 5.0–8.0)

## 2021-10-16 LAB — CBC
HCT: 38.6 % (ref 36.0–46.0)
Hemoglobin: 13 g/dL (ref 12.0–15.0)
MCH: 28.7 pg (ref 26.0–34.0)
MCHC: 33.7 g/dL (ref 30.0–36.0)
MCV: 85.2 fL (ref 80.0–100.0)
Platelets: 287 10*3/uL (ref 150–400)
RBC: 4.53 MIL/uL (ref 3.87–5.11)
RDW: 13.4 % (ref 11.5–15.5)
WBC: 5.8 10*3/uL (ref 4.0–10.5)
nRBC: 0 % (ref 0.0–0.2)

## 2021-10-16 LAB — WET PREP, GENITAL
Clue Cells Wet Prep HPF POC: NONE SEEN
Sperm: NONE SEEN
Trich, Wet Prep: NONE SEEN
WBC, Wet Prep HPF POC: <10 (ref <10)
Yeast Wet Prep HPF POC: NONE SEEN

## 2021-10-16 LAB — HCG, QUANTITATIVE, PREGNANCY: hCG, Beta Chain, Quant, S: 12069 m[IU]/mL — ABNORMAL HIGH (ref <5)

## 2021-10-16 LAB — HIV ANTIBODY (ROUTINE TESTING W REFLEX): HIV Screen 4th Generation wRfx: NONREACTIVE

## 2021-10-16 MED ORDER — ACETAMINOPHEN-CODEINE 300-30 MG PO TABS: 1.0000 | ORAL_TABLET | Freq: Four times a day (QID) | ORAL | 0 refills | Status: DC | PRN

## 2021-10-16 MED ORDER — IBUPROFEN 600 MG PO TABS: 600.0000 mg | ORAL_TABLET | Freq: Four times a day (QID) | ORAL | 0 refills | Status: DC | PRN

## 2021-10-16 MED ORDER — PROMETHAZINE HCL 12.5 MG PO TABS: 12.5000 mg | ORAL_TABLET | Freq: Four times a day (QID) | ORAL | 0 refills | Status: DC | PRN

## 2021-10-16 NOTE — MAU Note (Signed)
...  Katherine Holder is a 36 y.o. at [redacted]w[redacted]d here in MAU reporting: Vaginal bleeding that began around 1930 last night that was initially light pink and a light amount. She reports around 0630 this morning when she woke up she noted one small blood clot the size of a pencil eraser and reports her bleeding has turned to a dark brown color. She reports last night around 2000 she began experiencing constant back pain and around 0630 this morning she began experiencing intermittent lower abdominal cramping. Denies vaginal itching and vaginal odors. Denies recent IC.   LMP: 08/08/2021 Onset of complaint: Last night around 1900  Pain score:  6/10 lower back 5/10 lower abdomen  Lab orders placed from triage: UA

## 2021-10-16 NOTE — Discharge Instructions (Addendum)
Return to MAU: If you have heavier bleeding that soaks through more that 2 pads per hour for an hour or more If you bleed so much that you feel like you might pass out or you do pass out If you have significant abdominal pain that is not improved with Tylenol 1000 mg every 8 hours as needed for pain If you develop a fever > 100.5   You must return the Anora kit to the [facility] and have blood drawn for testing

## 2021-10-16 NOTE — MAU Provider Note (Signed)
History     CSN: [id number]  Arrival date and time: 10/16/21 9767   Event Date/Time   First Provider Initiated Contact with Patient 10/16/21 1006      Chief Complaint  Patient presents with   Back Pain   Vaginal Bleeding   Abdominal Pain   HPI [name] is a 36 y.o. year old [id number] female at 48w6dweeks gestation who presents to MAU reporting vaginal bleeding that started at 1Lucamalast night. She describes it as light pink in color and light in amount. This morning @ 0630 when she passed a small blood clot about the size of a pencil eraser. She describes the VB as dark, brown. She also complains of constant back pain since 2000; rated 6/10. She also reports intermittent, lower abdominal cramping; rating 5/10. She has had a miscarriage in the past and is fearful that she is having a miscarriage now. She has not vaginal complaints. Her last SI was 09/09/2021. [facility]; next appt is 11/05/2021.   OB History     Gravida  4   Para  2   Term  2   Preterm  0   AB  1   Living  2      SAB  1   IAB  0   Ectopic  0   Multiple  0   Live Births  2           Past Medical History:  Diagnosis Date   Depression    History of gestational diabetes    Hx of gestational diabetes in prior pregnancy, currently pregnant, second trimester 12/21/2019   Supervision of low-risk pregnancy 11/23/2019    Nursing Staff Provider Office Location  [facility] Dating   based on 16 week [place]Language  English Anatomy [place] Normal  Flu Vaccine  declined Genetic Screen  NIPS: declined   AFP:   First Screen:  Quad:   TDaP Vaccine   declines Hgb A1C or  GTT Early  Third trimester 1 Patient Communication    Ref Range & Units 2 mo ago Glucose, Fasting 65 - 91 mg/dL 85  Glucose, 1 hour 65 - 179 mg/dL 137  Glucose, 2 hour    History reviewed. No pertinent surgical history.  Family History  Problem Relation Age of Onset   Diabetes Mother    Hypertension Mother    Hyperlipidemia Mother      Social History   Tobacco Use   Smoking status: Never   Smokeless tobacco: Never  Vaping Use   Vaping Use: Never used  Substance Use Topics   Alcohol use: Never   Drug use: Never    Allergies: No Known Allergies  No medications prior to admission.    Review of Systems  Constitutional: Negative.   HENT: Negative.    Eyes: Negative.   Respiratory: Negative.    Cardiovascular: Negative.   Gastrointestinal: Negative.   Endocrine: Negative.   Genitourinary:  Positive for pelvic pain and vaginal bleeding.  Musculoskeletal:  Positive for back pain (lower).  Skin: Negative.   Allergic/Immunologic: Negative.   Neurological: Negative.   Hematological: Negative.   Psychiatric/Behavioral: Negative.     Physical Exam   Blood pressure 119/67, pulse 69, temperature 98.2 F (36.8 C), temperature source Oral, resp. rate 15, height 5' 6"  (1.676 m), weight 102.2 kg, last menstrual period 08/08/2021, SpO2 100 %, currently breastfeeding.  Physical Exam Vitals and nursing note reviewed. Exam conducted with a chaperone present.  Constitutional:      Appearance: Normal appearance. She is obese.  Cardiovascular:     Rate and Rhythm: Normal rate.  Pulmonary:     Effort: Pulmonary effort is normal.  Abdominal:     Palpations: Abdomen is soft.     Tenderness: There is abdominal tenderness (mild in lower pelvic region).  Genitourinary:    General: Normal vulva.     Comments: Pelvic exam: External genitalia normal, SE: vaginal walls pink and well rugated, cervix is smooth, pink, no lesions, small amt of dark, red blood vaginal d/c -- WP, GC/CT done, cervix visually closed, Uterus is non-tender, no CMT or friability, mild adnexal tenderness.  Musculoskeletal:        General: Normal range of motion.  Skin:    General: Skin is warm and dry.  Neurological:     Mental Status: She is alert and oriented to person, place, and time.  Psychiatric:        Mood and Affect: Mood normal.         Behavior: Behavior normal.        Thought Content: Thought content normal.        Judgment: Judgment normal.    MAU Course  Procedures  MDM CCUA UPT CBC ABO/Rh HCG Wet Prep GC/CT -- pending HIV -- pending OB < 14 wks [place] with TV  Results for orders placed or performed during the hospital encounter of 10/16/21 (from the past 24 hour(s))  Wet prep, genital     Status: None   Collection Time: 10/16/21 10:15 AM  Result Value Ref Range   Yeast Wet Prep HPF POC NONE SEEN NONE SEEN   Trich, Wet Prep NONE SEEN NONE SEEN   Clue Cells Wet Prep HPF POC NONE SEEN NONE SEEN   WBC, Wet Prep HPF POC <10 <10   Sperm NONE SEEN   CBC     Status: None   Collection Time: 10/16/21 10:34 AM  Result Value Ref Range   WBC 5.8 4.0 - 10.5 K/uL   RBC 4.53 3.87 - 5.11 MIL/uL   Hemoglobin 13.0 12.0 - 15.0 g/dL   HCT 38.6 36.0 - 46.0 %   MCV 85.2 80.0 - 100.0 fL   MCH 28.7 26.0 - 34.0 pg   MCHC 33.7 30.0 - 36.0 g/dL   RDW 13.4 11.5 - 15.5 %   Platelets 287 150 - 400 K/uL   nRBC 0.0 0.0 - 0.2 %  hCG, quantitative, pregnancy     Status: Abnormal   Collection Time: 10/16/21 10:34 AM  Result Value Ref Range   hCG, Beta Chain, Quant, S 12,069 (H) <5 mIU/mL  HIV Antibody (routine testing w rflx)     Status: None   Collection Time: 10/16/21 10:34 AM  Result Value Ref Range   HIV Screen 4th Generation wRfx Non Reactive Non Reactive  Urinalysis, Routine w reflex microscopic Urine, Clean Catch     Status: Abnormal   Collection Time: 10/16/21 10:49 AM  Result Value Ref Range   Color, Urine YELLOW YELLOW   APPearance HAZY (A) CLEAR   Specific Gravity, Urine 1.020 1.005 - 1.030   pH 6.0 5.0 - 8.0   Glucose, UA NEGATIVE NEGATIVE mg/dL   Hgb urine dipstick MODERATE (A) NEGATIVE   Bilirubin Urine NEGATIVE NEGATIVE   Ketones, ur NEGATIVE NEGATIVE mg/dL   Protein, ur NEGATIVE NEGATIVE mg/dL   Nitrite NEGATIVE NEGATIVE   Leukocytes,Ua NEGATIVE NEGATIVE   RBC / HPF >50 (H) 0 - 5 RBC/hpf  WBC, UA 0-5 0  - 5 WBC/hpf   Bacteria, UA NONE SEEN NONE SEEN   Squamous Epithelial / LPF 0-5 0 - 5   Mucus PRESENT     US OB LESS THAN 14 WEEKS WITH OB TRANSVAGINAL  Result Date: 10/16/2021 CLINICAL DATA:  Vaginal bleeding, pelvic pain EXAM: OBSTETRIC <14 WK [place] AND TRANSVAGINAL OB US TECHNIQUE: Both transabdominal and transvaginal ultrasound examinations were performed for complete evaluation of the gestation as well as the maternal uterus, adnexal regions, and pelvic cul-de-sac. Transvaginal technique was performed to assess early pregnancy. COMPARISON:  None Available. FINDINGS: Intrauterine gestational sac: Single. Gestational sac is noted in the central portion of lower uterine segment. Yolk sac:  Not seen Embryo:  Seen Cardiac Activity: Not seen CRL:  15.8 mm   8 w   0 d                  [place] EDC: 05/28/2022 Subchorionic hemorrhage:  None visualized. Maternal uterus/adnexae: No significant abnormalities are seen. Trace amount of free fluid is seen in cul-de-sac. IMPRESSION: Gestational sac is lower than usual in lower uterine segment. There are no demonstrable fetal heartbeats. By crown-rump length measurement, estimated gestational age is 30 weeks. Findings suggest failed gestation with fetal demise and abortion in progress. Small amount of free fluid in pelvis may suggest recent rupture of ovarian cyst or follicle. Electronically Signed   By: Elmer Picker M.D.   On: 10/16/2021 11:17     Assessment and Plan  1. Incomplete miscarriage - Information provided on incomplete miscarriage, managing pregnancy loss, recurrent pregnancy loss, threatened miscarriage  - Anora kit sent home with patient. With patient's permission, her contact information was shared with Nunzio Cory (representative from [place]) to keep a close f/u on her samples and results.  2. Vaginal bleeding affecting early pregnancy - Information provided on vaginal bleeding in pregnancy   3. Abdominal pain affecting pregnancy - Information  provided on abdominal pain in pregnancy   4. [redacted] weeks gestation of pregnancy   - Discharge patient - Go to [facility] to return Anora kit and have blood work drawn after the miscarriage has occurred - F/U appt with provider in 2 weeks - Patient verbalized an understanding of the plan of care and agrees.    Laury Deep, CNM 10/16/2021, 10:06 AM

## 2021-10-17 LAB — GC/CHLAMYDIA PROBE AMP (~~LOC~~) NOT AT ARMC
Chlamydia: NEGATIVE
Comment: NEGATIVE
Comment: NORMAL
Neisseria Gonorrhea: NEGATIVE

## 2021-10-30 ENCOUNTER — Ambulatory Visit: Payer: Commercial Managed Care - HMO | Admitting: Certified Nurse Midwife

## 2021-10-30 DIAGNOSIS — Z3009 Encounter for other general counseling and advice on contraception: Secondary | ICD-10-CM

## 2021-10-30 DIAGNOSIS — O039 Complete or unspecified spontaneous abortion without complication: Secondary | ICD-10-CM

## 2021-10-30 DIAGNOSIS — F32A Depression, unspecified: Secondary | ICD-10-CM

## 2021-11-05 ENCOUNTER — Telehealth: Payer: Medicaid Other

## 2021-11-12 ENCOUNTER — Encounter: Payer: Self-pay | Admitting: Certified Nurse Midwife

## 2021-11-12 ENCOUNTER — Ambulatory Visit (INDEPENDENT_AMBULATORY_CARE_PROVIDER_SITE_OTHER): Payer: Commercial Managed Care - HMO | Admitting: Certified Nurse Midwife

## 2021-11-12 ENCOUNTER — Encounter: Payer: Medicaid Other | Admitting: Obstetrics and Gynecology

## 2021-11-12 VITALS — BP 120/69 | HR 73 | Ht 65.0 in | Wt 224.6 lb

## 2021-11-12 DIAGNOSIS — Z8759 Personal history of other complications of pregnancy, childbirth and the puerperium: Secondary | ICD-10-CM | POA: Diagnosis not present

## 2021-11-12 NOTE — Progress Notes (Signed)
   Subjective:   Patient Name: Katherine Holder, female   DOB: 12-28-1985, 36 y.o.  MRN: [id number]  HPI Seen for follow up of SAB 4 weeks ago. Denies bleeding, cramping. Menses has not returned yet. Does not plan on becoming pregnant soon - [place]OB [place] and she does not plan on seeing him until next Spring/Summer.   Review of Systems Pertinent items noted in HPI and remainder of comprehensive ROS otherwise negative.     Objective:   Physical Exam  Constitutional: She is oriented to person, place, and time. She appears well-developed and well-nourished.  Cardiovascular: Normal rate.  Abdominal: Soft. She exhibits no distension.  Neurological: She is alert and oriented to person, place, and time.  Skin: Skin is warm and dry.  Psychiatric: She has a normal mood and affect. Her behavior is normal. Judgment and thought content normal.     Assessment & Plan:  1. SAB (spontaneous abortion) - Anora kit completed - Condolences expressed, pt handling MC well but may want additional testing depending on how Anora results come back  Follow up PRN or for annual exam.  Gabriel Carina MSN, CNM, [facility] 11/12/21  9:35 PM

## 2021-11-12 NOTE — Progress Notes (Signed)
Here for miscarriage follow up. She states she is doing well. Denies any bleeding. Declines birth control. She brought her Anora kit for help completing and sending. Cheek swab collected and requisition filled out and kit sent. Staci Acosta

## 2023-08-06 ENCOUNTER — Other Ambulatory Visit: Payer: Self-pay | Admitting: Internal Medicine

## 2023-08-06 DIAGNOSIS — E010 Iodine-deficiency related diffuse (endemic) goiter: Secondary | ICD-10-CM

## 2023-08-06 DIAGNOSIS — R490 Dysphonia: Secondary | ICD-10-CM

## 2023-08-09 ENCOUNTER — Ambulatory Visit
Admission: RE | Admit: 2023-08-09 | Discharge: 2023-08-09 | Disposition: A | Discharge status: Home / Self Care | Source: Ambulatory Visit | Attending: Internal Medicine | Admitting: Internal Medicine

## 2023-08-09 DIAGNOSIS — E010 Iodine-deficiency related diffuse (endemic) goiter: Secondary | ICD-10-CM

## 2023-08-09 DIAGNOSIS — R490 Dysphonia: Secondary | ICD-10-CM

## 2023-08-13 DIAGNOSIS — R490 Dysphonia: Secondary | ICD-10-CM | POA: Insufficient documentation

## 2023-08-13 DIAGNOSIS — J383 Other diseases of vocal cords: Secondary | ICD-10-CM | POA: Insufficient documentation

## 2023-08-23 NOTE — Telephone Encounter (Signed)
 Pt advised number below and voices understanding

## 2023-10-25 ENCOUNTER — Ambulatory Visit: Admitting: *Deleted

## 2023-10-25 DIAGNOSIS — R0989 Other specified symptoms and signs involving the circulatory and respiratory systems: Secondary | ICD-10-CM | POA: Insufficient documentation

## 2023-10-25 DIAGNOSIS — Z3201 Encounter for pregnancy test, result positive: Secondary | ICD-10-CM

## 2023-10-25 DIAGNOSIS — Z3200 Encounter for pregnancy test, result unknown: Secondary | ICD-10-CM

## 2023-10-25 DIAGNOSIS — Z3490 Encounter for supervision of normal pregnancy, unspecified, unspecified trimester: Secondary | ICD-10-CM

## 2023-10-25 LAB — POCT PREGNANCY, URINE: Preg Test, Ur: POSITIVE — AB

## 2023-10-25 MED ORDER — PRENATAL PLUS VITAMIN/MINERAL 27-1 MG PO TABS: 1.0000 | ORAL_TABLET | Freq: Every day | ORAL | 11 refills | Status: AC

## 2023-10-25 NOTE — Progress Notes (Signed)
 Voice Evaluation Behavioral & Qualitative Analysis of Voice and Resonance (CPT [phone]) Laryngeal Function Studies (CPT [phone])   Payor: Payor: [employer] / Plan: [employer] / Product Type: HMO /   SUBJECTIVE:  [name] is a pleasant 38 y.o. seen at the kind request of Dr. 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 [name] underwent a flexible video laryngostroboscopy with Dr. Brien on 10/25/2023. Please see the physician's report for details.  History of Symptoms: On this date, the patient presented with complaints of hoarseness/raspiness, vocal fatigue, voice breaks, effortful speaking, shortness of breath, trouble swallowing, throat clearing, and decreased vocal volume. These symptoms were sudden in onset and began in June of this year following a fall. The patient reports that the problems have improved somewhat since onset. She endorses a feeling of dryness and a sensation of something stuck in her throat that triggers throat clearing. She states that others often tell her they can't hear her, and when she goes to repeat herself, she clears her throat. The patient is an occupational voice user. 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 She lives with her spouse and two children (ages 74 and 3). She is most bothered by intermittent hoarseness and throat clearing. She reports that her voice does not prevent her from doing anything she needs to do and feels it is sufficient for her day-to-day needs although it can be frustrating when it is inconsistent. 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 Past treatments tried: None.  Medical  History[1] Surgical History[2] Social History   Tobacco Use  . Smoking status: Never    Passive exposure: Never  . Smokeless tobacco: Never  Substance Use Topics  . 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[employer])  Professional vocal demands: moderate to extensive History of smoking/tobacco use: N/a  Water intake per day: 64 oz  Phonotraumatic Behaviors: throat clearing  OBJECTIVE:   Auditory-Perceptual Evaluation:  Voice characterized by: mild roughness and breathiness  Resonance: largely pharyngeal in locus Respiratory support during conversation: adequate Visible muscle recruitment during phonation: No  GRBAS Severity Rating:  0= Normal, 1= Mild, 2= Moderate, 3= Severe  Grade: 1 Roughness: 1 Breathiness: 1 Asthenia: 0 Strain: 0  Acoustic and Aerodynamic Assessment Data (CPT 92520) Normative Data Darcey et al., 2017, Murton et al., 2020)  LARYNGEAL FUNCTION TEST RESULTS MEASUREMENT RESULT 10/25/2023 FEMALE NORM FEMALE NORM   Speaking Fundamental Frequency  Reading Passage Mean F0 178.55 Hz 200.42 Hz (17.5 127.65 Hz (19.8)  Standard Deviation (SD) Mean F0 30.77 Hz 32.6 Hz (10.45) 41.23 Hz (25.6)  Physiologic Frequency Range  Lowest F0     139.39 Hz    Highest F0 300.21 Hz    Vocal Quality: Running Speech: We were away a year ago  CPPspeech Mean 6.104 dB 6.11 6.11  CPPspeech SD 3.845 dB 3.03 dB 3.07 dB  CSID -3.788 6.44 (SD 7.94) -4.48 (7.94)  Vocal Quality: Sustained /a/  CPPvowel Mean 14.476 dB 11.46  11.46  CPPvowel SD 1.408 dB 0.43 dB 0.63 dB  CSID  -2.442 4.43 (SD 8.97)    3.58 (SD 10.37)     DIAGNOSTIC VOICE THERAPY:  Stimulability testing was completed and the following techniques were successful in improving voice quality:?   Stimulability tasks trialed:  Financial controller Therapy Beneficial Cueing Techniques: verbal cueing, visual cueing, and modeling of target Patient's level of stimulability for improvement during trial therapy: good Provided pt with VF hydration and throat clearing suppression handout?   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 Recommended Consults:  None currently Treatment Frequency and Duration: voice therapy, 1x/week for 3-5 sessions  Education:    Yes, provided as follows:   Barriers to Learning:  No Barriers   Learning Needs:  Illness/disease and Treatment plan   Education Provided:  Per learning needs listed above   Audience / Response:  Patient  Applied knowledge, Verbalized understanding, and Demonstrated skill   Mode:  Explanation, Demonstration, and Printed material provided   Interpreter Utilized: N/A  Long Term Goals: 1.  Reestablish phonation with adequate airflow and forward resonance in functional communicative contexts.   2.  The patient will learn to avoid dysphonic exacerbations and how to positively influence remissions of exacerbations to restore functional voice use for daily occupational, social, and emotional demands.  3.  The patient will demonstrate the ability to perform all assigned voice exercises with sufficient accuracy to maintain targeted voicing outside of  the therapy session and continue crossover into ADLs.   4.  The patient will implement appropriate vocal/laryngeal hygiene recommendations on a daily basis.  Courteney McClutchy, MA, CCC-SLP  Time in: 0900 Time out: 1000  This clinical documentation was prepared with the assistance of [employer].'       [1] No past medical history on file. [2] No past surgical history on file. [3] No Known Allergies [4] No current outpatient medications on file.   No current facility-administered medications for this visit.

## 2023-10-25 NOTE — Progress Notes (Signed)
 Possible Pregnancy  Patient came by to leave a urine sample for a UPT.  UPT in office today is positive. Pt reports first positive home UPT last week (could not remember exact date). Reviewed dating with patient:   LMP: 09/16/2023 EDD: 06/22/2024 5w 4d today  OB history reviewed. Reviewed medications and allergies with patient; list of medications safe to take during pregnancy given.  Patient requested refills on prenatal she took with prior pregnancy; refills sent to Pharmacy on file and was verified with patient to be the correct pharmacy.  MAU precautions discussed and patient voiced understanding.    Rosina, RN

## 2023-12-01 ENCOUNTER — Encounter: Payer: Self-pay | Admitting: Obstetrics and Gynecology

## 2023-12-01 ENCOUNTER — Other Ambulatory Visit: Payer: Self-pay | Admitting: *Deleted

## 2023-12-01 DIAGNOSIS — O219 Vomiting of pregnancy, unspecified: Secondary | ICD-10-CM

## 2023-12-01 MED ORDER — PROMETHAZINE HCL 25 MG PO TABS: 25.0000 mg | ORAL_TABLET | Freq: Four times a day (QID) | ORAL | 1 refills | Status: AC | PRN

## 2023-12-02 ENCOUNTER — Telehealth (INDEPENDENT_AMBULATORY_CARE_PROVIDER_SITE_OTHER)

## 2023-12-02 DIAGNOSIS — Z3A11 11 weeks gestation of pregnancy: Secondary | ICD-10-CM | POA: Diagnosis not present

## 2023-12-02 DIAGNOSIS — Z3491 Encounter for supervision of normal pregnancy, unspecified, first trimester: Secondary | ICD-10-CM | POA: Diagnosis not present

## 2023-12-02 DIAGNOSIS — Z8632 Personal history of gestational diabetes: Secondary | ICD-10-CM | POA: Insufficient documentation

## 2023-12-02 DIAGNOSIS — Z3490 Encounter for supervision of normal pregnancy, unspecified, unspecified trimester: Secondary | ICD-10-CM | POA: Insufficient documentation

## 2023-12-02 NOTE — Patient Instructions (Addendum)
 Safe Medications in Pregnancy   Acne:  Benzoyl Peroxide  Salicylic Acid   Backache/Headache:  Tylenol : 2 regular strength every 4 hours OR               2 Extra strength every 6 hours   Colds/Coughs/Allergies:  Benadryl  (alcohol free) 25 mg every 6 hours as needed  Breath right strips  Claritin  Cepacol throat lozenges  Chloraseptic throat spray  Cold-Eeze- up to three times per day  Cough drops, alcohol free  Flonase (by prescription only)  Guaifenesin  Mucinex  Robitussin DM (plain only, alcohol free)  Saline nasal spray/drops  Sudafed (pseudoephedrine) & Actifed * use only after [redacted] weeks gestation and if you do not have high blood pressure  Tylenol   Vicks Vaporub  Zinc lozenges  Zyrtec   Constipation:  Colace  Ducolax suppositories  Fleet enema  Glycerin  suppositories  Metamucil  Milk of magnesia  Miralax  Senokot  Smooth move tea   Diarrhea:  Kaopectate  Imodium A-D   *NO pepto Bismol   Hemorrhoids:  Anusol  Anusol HC  Preparation H  Tucks   Indigestion:  Tums  Maalox  Mylanta  Zantac  Pepcid   Insomnia:  Benadryl  (alcohol free) 25mg  every 6 hours as needed  Tylenol  PM  Unisom, no Gelcaps   Leg Cramps:  Tums  MagGel   Nausea/Vomiting:  Bonine  Dramamine  Emetrol  Ginger extract  Sea bands  Meclizine  Nausea medication to take during pregnancy:  Unisom (doxylamine succinate 25 mg tablets) Take one tablet daily at bedtime. If symptoms are not adequately controlled, the dose can be increased to a maximum recommended dose of two tablets daily (1/2 tablet in the morning, 1/2 tablet mid-afternoon and one at bedtime).  Vitamin B6 100mg  tablets. Take one tablet twice a day (up to 200 mg per day).   Skin Rashes:  Aveeno products  Benadryl  cream or 25mg  every 6 hours as needed  Calamine Lotion  1% cortisone cream   Yeast infection:  Gyne-lotrimin 7  Monistat 7    **If taking multiple medications, please check labels to avoid  duplicating the same active ingredients  **take medication as directed on the label  ** Do not exceed 4000 mg of tylenol  in 24 hours  **Do not take medications that contain aspirin or ibuprofen             If you are interested in an outpatient lactation consultation -- available in-office or virtually -- please reach out to us  at:  [facility] (First Floor) ?? [address], [place], [place]  ?? [phone] Please leave a message on our lactation voicemail box. We welcome any lactation-related questions or concerns -- our team is here to support you and your baby.  Lactation Support Groups Join us  at: [facility] ?? Tuesdays, 10:00 AM - 12:00 PM ?? [address], [place] [employer], [employer] [place]  Lactating parents and lap babies are welcome!  ?? [email or web address]  ?? [email or web address]

## 2023-12-02 NOTE — Progress Notes (Signed)
 New OB Intake  I connected with Katherine Holder  on 12/02/23 at 10:15 AM EST by MyChart Video Visit and verified that I am speaking with the correct person using two identifiers. Nurse is located at [facility] and pt is located at home.  I discussed the limitations, risks, security and privacy concerns of performing an evaluation and management service by telephone and the availability of in person appointments. I also discussed with the patient that there may be a patient responsible charge related to this service. The patient expressed understanding and agreed to proceed.  I explained I am completing New OB Intake today. We discussed EDD of 06/22/2024 based on LMP of 09/16/23. Pt is [id number]. I reviewed her allergies, medications and Medical/Surgical/OB history.     Patient Active Problem List   Diagnosis Date Noted   Encounter for supervision of low-risk pregnancy, antepartum 12/02/2023   History of gestational diabetes 12/02/2023     Concerns addressed today Patient expressed concern for HSV testing, and is worried about possible of effects during pregnancy.  She reports her current partner has HSV, but they use condoms and he takes antivirals if he has outbreak.  She denies ever having lesion.  I advised pt that true testing involves testing an active outbreak lesion.  Encouraged pt to be observant for any genital lesions during pregnancy and tell provider if she notices any concerning areas.   Delivery Plans Plans to deliver at [facility]. Discussed the nature of our practice with multiple providers including residents and students as well as female and female providers. Due to the size of the practice, the delivering provider may not be the same as those providing prenatal care.   Patient is interested in water birth.  MyChart/Babyscripts MyChart access verified. I explained pt will have some visits in office and some virtually. Babyscripts instructions given and order placed.    Blood  Pressure Cuff/Weight Scale Patient has blood pressure cuff at home Explained after first prenatal appt pt will check weekly and document in Babyscripts. Patient does not have weight scale; patient may purchase if they desire to track weight weekly in Babyscripts.  Anatomy US  Explained first scheduled US  will be around 19 weeks. Anatomy US  scheduled for 02/07/23 at 10:00am.  Is patient a CenteringPregnancy candidate?  Declined Declined due to Schedule    Is patient a Mom+Baby Combined Care candidate?  Not a candidate     Is patient a candidate for Babyscripts Optimization?    First visit review I reviewed new OB appt with patient. Explained pt will be seen by Dr. Nicholaus at first visit 12/06/23 at 2:15pm. Discussed Natera genetic screening with patient. She is undecided about [facility] and [facility] due to insurance. Routine prenatal labs and pap smear needed at [facility] OB visit.   Last Pap Diagnosis  Date Value Ref Range Status  06/21/2020   Final   - Negative for intraepithelial lesion or malignancy (NILM)    Waddell LITTIE Burows, RN 12/02/2023  10:33 AM

## 2023-12-06 ENCOUNTER — Other Ambulatory Visit: Payer: Self-pay

## 2023-12-06 ENCOUNTER — Other Ambulatory Visit (HOSPITAL_COMMUNITY)
Admission: RE | Admit: 2023-12-06 | Discharge: 2023-12-06 | Disposition: A | Discharge status: Home / Self Care | Source: Ambulatory Visit | Attending: Obstetrics and Gynecology | Admitting: Obstetrics and Gynecology

## 2023-12-06 ENCOUNTER — Encounter: Payer: Self-pay | Admitting: Obstetrics and Gynecology

## 2023-12-06 ENCOUNTER — Ambulatory Visit (INDEPENDENT_AMBULATORY_CARE_PROVIDER_SITE_OTHER): Payer: Self-pay | Admitting: Obstetrics and Gynecology

## 2023-12-06 VITALS — BP 120/76 | HR 123 | Wt 224.5 lb

## 2023-12-06 DIAGNOSIS — Z3490 Encounter for supervision of normal pregnancy, unspecified, unspecified trimester: Secondary | ICD-10-CM

## 2023-12-06 DIAGNOSIS — Z6837 Body mass index (BMI) 37.0-37.9, adult: Secondary | ICD-10-CM

## 2023-12-06 DIAGNOSIS — Z1332 Encounter for screening for maternal depression: Secondary | ICD-10-CM | POA: Diagnosis not present

## 2023-12-06 DIAGNOSIS — Z8632 Personal history of gestational diabetes: Secondary | ICD-10-CM | POA: Diagnosis not present

## 2023-12-06 DIAGNOSIS — Z3A11 11 weeks gestation of pregnancy: Secondary | ICD-10-CM | POA: Diagnosis not present

## 2023-12-06 DIAGNOSIS — Z3491 Encounter for supervision of normal pregnancy, unspecified, first trimester: Secondary | ICD-10-CM

## 2023-12-06 NOTE — Progress Notes (Unsigned)
 INITIAL PRENATAL VISIT NOTE  Subjective:  Katherine Holder is a 38 y.o. [id number] at [redacted]w[redacted]d by LMP being seen today for her initial prenatal visit. She has an obstetric history significant for 2 x term SVD. She has a medical history significant for h/o gestational diabetes in pregnancy, diet controlled.  Patient reports no complaints.   . Vag. Bleeding: None.  Movement: Absent. Denies leaking of fluid.    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  OB History  Gravida Para Term Preterm AB Living  5 2 2  0 2 2  SAB IAB Ectopic Multiple Live Births  2 0 0  2    # Outcome Date GA Lbr Len/2nd Weight Sex Type Anes PTL Lv  5 Current           4 SAB 10/16/21 [redacted]w[redacted]d         3 Term 05/15/20 [redacted]w[redacted]d 06:25 / 00:13 7 lb 14.5 oz (3.586 kg) F Vag-Spont EPI, Local  LIV     Birth Comments: WNL  2 SAB 04/2018          1 Term 03/31/16 [redacted]w[redacted]d   F Vag-Spont None  LIV    Social History   Socioeconomic History   Marital status: Married    Spouse name: Not on file   Number of children: 2   Years of education: Not on file   Highest education level: Not on file  Occupational History   Occupation: Runner, Broadcasting/film/video    Comment: [employer]  Tobacco Use   Smoking status: Never   Smokeless tobacco:  Never  Vaping Use   Vaping status: Never Used  Substance and Sexual Activity   Alcohol use: Never   Drug use: Never   Sexual activity: Not Currently  Other Topics Concern   Not on file  Social History Narrative   Not on file   Social Drivers of Health   Financial Resource Strain: Not on File (07/06/2022)   Received from [employer]    Financial Resource Strain: 0  Food Insecurity: Not at Risk (08/06/2023)   Received from [facility] Insecurity    Within the past 12 months, you worried that your food would run out before you got money to buy more.: 1  Transportation Needs: Not at Risk (08/06/2023)   Received from [facility] Needs    In the past 12 months, has lack of transportation kept you from medical appointments, meetings, work or from getting things needed for daily living?: 1  Physical Activity: Not on File (07/06/2022)   Received from [facility]   Physical  Activity    Physical Activity: 0  Stress: Not on File (07/06/2022)   Received from [facility]   Stress    Stress: 0  Social Connections: Not on File (09/23/2022)   Received from [facility]   Social Connections    Connectedness: 0    Family History  Problem Relation Age of Onset   Diabetes Mother    Hypertension Mother    Hyperlipidemia Mother    Hypertension Father      Current Outpatient Medications:    Ferrous Sulfate (IRON PO), Take 1 tablet by mouth daily., Disp: , Rfl:    Prenatal Vit-Fe Fumarate-FA (PRENATAL PLUS VITAMIN/MINERAL) 27-1 MG TABS, Take 1 tablet by mouth daily., Disp: 30 tablet, Rfl: 11   promethazine  (PHENERGAN ) 25 MG tablet, Take 1 tablet (25 mg total) by mouth every 6 (six) hours as needed for nausea or vomiting., Disp: 30 tablet, Rfl: 1   acetaminophen -codeine  (TYLENOL  #3) 300-30 MG tablet, Take 1-2 tablets by mouth every 6 (six) hours as needed for moderate pain. (Patient not taking: Reported on 12/02/2023), Disp: 15 tablet, Rfl: 0   promethazine  (PHENERGAN ) 12.5 MG  tablet, Take 1 tablet (12.5 mg total) by mouth every 6 (six) hours as needed for nausea or vomiting. (Patient not taking: Reported on 10/25/2023), Disp: 30 tablet, Rfl: 0  No Known Allergies  Review of Systems: Negative except for what is mentioned in HPI.  Objective:   Vitals:   12/06/23 1428  BP: 120/76  Pulse: (!) 123  Weight: 224 lb 8 oz (101.8 kg)    Fetal Status: Fetal Heart Rate (bpm): 164   Movement: Absent     Physical Exam: BP 120/76   Pulse (!) 123   Wt 224 lb 8 oz (101.8 kg)   LMP 09/16/2023 (Exact Date)   BMI 37.36 kg/m  CONSTITUTIONAL: Well-developed, well-nourished female in no acute distress.  NEUROLOGIC: Alert and oriented to person, place, and time. Normal reflexes, muscle tone coordination. No cranial nerve deficit noted. PSYCHIATRIC: Normal mood and affect. Normal behavior. Normal judgment and thought content. SKIN: Skin is warm and dry. No rash noted. Not diaphoretic. No erythema. No pallor. HENT:  Normocephalic, atraumatic, External right and left ear normal. Oropharynx is clear and moist EYES: Conjunctivae and EOM are normal. Pupils are equal, round, and reactive to light. No scleral icterus.  NECK: Normal range of motion, supple, no masses CARDIOVASCULAR: Normal heart rate noted, regular rhythm RESPIRATORY: Effort normal, no problems with respiration noted BREASTS: deferred ABDOMEN: Soft, ***nontender, ***nondistended, gravid. GU: ***normal appearing external female genitalia, ***multiparous***nulliparous ***normal appearing cervix, scant white discharge in vagina, no lesions noted Bimanual: *** weeks sized uterus, no adnexal tenderness or palpable lesions noted MUSCULOSKELETAL: Normal range of motion. EXT:  No edema and no tenderness. ***2+ distal pulses.   Assessment and Plan:  Pregnancy: [id number] at [redacted]w[redacted]d by LMP  There are no diagnoses linked to this encounter.  {Blank single:19197::Term,Preterm} labor symptoms and general obstetric  precautions including but not limited to vaginal bleeding, contractions, leaking of fluid and fetal movement were reviewed in detail with the patient.  Please refer to After Visit Summary for other counseling recommendations.   No follow-ups on file.  [name] 12/06/2023 2:44 PM

## 2023-12-07 LAB — CBC/D/PLT+RPR+RH+ABO+RUBIGG...
Antibody Screen: NEGATIVE
Basophils Absolute: 0 x10E3/uL (ref 0.0–0.2)
Basos: 1 %
EOS (ABSOLUTE): 0.1 x10E3/uL (ref 0.0–0.4)
Eos: 2 %
HCV Ab: NONREACTIVE
HIV Screen 4th Generation wRfx: NONREACTIVE
Hematocrit: 36.8 % (ref 34.0–46.6)
Hemoglobin: 12.3 g/dL (ref 11.1–15.9)
Hepatitis B Surface Ag: NEGATIVE
Immature Grans (Abs): 0 x10E3/uL (ref 0.0–0.1)
Immature Granulocytes: 0 %
Lymphocytes Absolute: 2.2 x10E3/uL (ref 0.7–3.1)
Lymphs: 35 %
MCH: 29 pg (ref 26.6–33.0)
MCHC: 33.4 g/dL (ref 31.5–35.7)
MCV: 87 fL (ref 79–97)
Monocytes Absolute: 0.7 x10E3/uL (ref 0.1–0.9)
Monocytes: 12 %
Neutrophils Absolute: 3.2 x10E3/uL (ref 1.4–7.0)
Neutrophils: 50 %
Platelets: 268 x10E3/uL (ref 150–450)
RBC: 4.24 x10E6/uL (ref 3.77–5.28)
RDW: 13.7 % (ref 11.7–15.4)
RPR Ser Ql: NONREACTIVE
Rh Factor: POSITIVE
Rubella Antibodies, IGG: 5.66 {index} (ref >0.99)
WBC: 6.4 x10E3/uL (ref 3.4–10.8)

## 2023-12-07 LAB — COMPREHENSIVE METABOLIC PANEL WITH GFR
ALT: 9 IU/L (ref 0–32)
AST: 17 IU/L (ref 0–40)
Albumin: 4 g/dL (ref 3.9–4.9)
Alkaline Phosphatase: 51 IU/L (ref 41–116)
BUN/Creatinine Ratio: 14 (ref 9–23)
BUN: 9 mg/dL (ref 6–20)
Bilirubin Total: <0.2 mg/dL (ref 0.0–1.2)
CO2: 20 mmol/L (ref 20–29)
Calcium: 9.5 mg/dL (ref 8.7–10.2)
Chloride: 106 mmol/L (ref 96–106)
Creatinine, Ser: 0.65 mg/dL (ref 0.57–1.00)
Globulin, Total: 2.6 g/dL (ref 1.5–4.5)
Glucose: 95 mg/dL (ref 70–99)
Potassium: 4.2 mmol/L (ref 3.5–5.2)
Sodium: 138 mmol/L (ref 134–144)
Total Protein: 6.6 g/dL (ref 6.0–8.5)
eGFR: 116 mL/min/1.73 (ref >59)

## 2023-12-07 LAB — HEMOGLOBIN A1C
Est. average glucose Bld gHb Est-mCnc: 120 mg/dL
Hgb A1c MFr Bld: 5.8 % — ABNORMAL HIGH (ref 4.8–5.6)

## 2023-12-07 LAB — GC/CHLAMYDIA PROBE AMP (~~LOC~~) NOT AT ARMC
Chlamydia: NEGATIVE
Comment: NEGATIVE
Comment: NORMAL
Neisseria Gonorrhea: NEGATIVE

## 2023-12-07 LAB — PROTEIN / CREATININE RATIO, URINE
Creatinine, Urine: 276.5 mg/dL
Protein, Ur: 28.7 mg/dL
Protein/Creat Ratio: 104 mg/g{creat} (ref 0–200)

## 2023-12-07 LAB — HCV INTERPRETATION

## 2023-12-07 LAB — TSH: TSH: 0.602 u[IU]/mL (ref 0.450–4.500)

## 2023-12-08 ENCOUNTER — Encounter: Payer: Self-pay | Admitting: Obstetrics and Gynecology

## 2023-12-08 ENCOUNTER — Ambulatory Visit (INDEPENDENT_AMBULATORY_CARE_PROVIDER_SITE_OTHER)

## 2023-12-08 ENCOUNTER — Ambulatory Visit: Payer: Self-pay | Admitting: Obstetrics and Gynecology

## 2023-12-08 ENCOUNTER — Other Ambulatory Visit: Payer: Self-pay | Admitting: Obstetrics and Gynecology

## 2023-12-08 ENCOUNTER — Other Ambulatory Visit: Payer: Self-pay

## 2023-12-08 DIAGNOSIS — Z3490 Encounter for supervision of normal pregnancy, unspecified, unspecified trimester: Secondary | ICD-10-CM

## 2023-12-08 DIAGNOSIS — Z6837 Body mass index (BMI) 37.0-37.9, adult: Secondary | ICD-10-CM

## 2023-12-08 DIAGNOSIS — Z8632 Personal history of gestational diabetes: Secondary | ICD-10-CM

## 2023-12-08 MED ORDER — ASPIRIN 81 MG PO TBEC: 81.0000 mg | DELAYED_RELEASE_TABLET | Freq: Every day | ORAL | 2 refills | Status: AC

## 2023-12-08 NOTE — Telephone Encounter (Signed)
 I called patient to notify and heard a message voicemail is full. Will send mychart message and leave in inbox.  Rock Skip PEAK

## 2023-12-08 NOTE — Telephone Encounter (Signed)
-----   Message from [name] sent at 12/08/2023  4:03 PM EST ----- Normal OB panel except elevated A1c, needs early 2 hr ----- Message ----- From: Rebecka Memos Lab Results In Sent: 12/07/2023   7:39 AM EST To: [name], MD

## 2023-12-09 LAB — CULTURE, OB URINE

## 2023-12-09 LAB — URINE CULTURE, OB REFLEX

## 2023-12-13 ENCOUNTER — Other Ambulatory Visit

## 2023-12-13 ENCOUNTER — Other Ambulatory Visit: Payer: Self-pay

## 2023-12-13 DIAGNOSIS — R7309 Other abnormal glucose: Secondary | ICD-10-CM

## 2023-12-14 ENCOUNTER — Other Ambulatory Visit: Payer: Self-pay

## 2023-12-14 ENCOUNTER — Other Ambulatory Visit

## 2023-12-14 DIAGNOSIS — R7309 Other abnormal glucose: Secondary | ICD-10-CM

## 2023-12-15 ENCOUNTER — Ambulatory Visit: Payer: Self-pay | Admitting: Family Medicine

## 2023-12-15 DIAGNOSIS — O24419 Gestational diabetes mellitus in pregnancy, unspecified control: Secondary | ICD-10-CM

## 2023-12-15 LAB — GLUCOSE TOLERANCE, 2 HOURS W/ 1HR
Glucose, 1 hour: 168 mg/dL (ref 70–179)
Glucose, 2 hour: 176 mg/dL — ABNORMAL HIGH (ref 70–152)
Glucose, Fasting: 71 mg/dL (ref 70–91)

## 2023-12-15 MED ORDER — ACCU-CHEK GUIDE W/DEVICE KIT: 1.0000 | PACK | Freq: Four times a day (QID) | 0 refills | Status: AC

## 2023-12-15 MED ORDER — ACCU-CHEK GUIDE TEST VI STRP: ORAL_STRIP | 12 refills | Status: AC

## 2023-12-15 MED ORDER — ACCU-CHEK SOFTCLIX LANCETS MISC: 11 refills | Status: AC

## 2023-12-15 NOTE — Telephone Encounter (Addendum)
 Called pt to advise her of GDM results and fingerstick glucose supplies sent to her pharmacy.  Education referral ordered, pt agreeable to GDM education appointment 12/23/23 at 3:15pm.  She had no further questions at this time.   [place], RN   ----- Message from Norleen LULLA Rover sent at 12/15/2023  1:30 PM EST ----- Hello, this patient failed her early 2-hour glucose test.  Can we please get her testing supplies.  And set up with the diabetic coordinator. ----- Message ----- From: Rebecka Memos Lab Results In Sent: 12/15/2023   4:36 AM EST To: Norleen Rover LULLA, MD

## 2023-12-23 ENCOUNTER — Other Ambulatory Visit

## 2023-12-23 NOTE — Progress Notes (Unsigned)
 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   Supervision of low-risk pregnancy 11/23/2019    Nursing Staff Provider Office Location  [facility] Dating   based on 16 week US  Language  English Anatomy US   Normal  Flu Vaccine  declined Genetic Screen  NIPS: declined   AFP:   First Screen:  Quad:   TDaP Vaccine   declines Hgb A1C or  GTT Early  Third trimester 1 Patient Communication    Ref Range & Units 2 mo ago Glucose, Fasting 65 - 91 mg/dL 85  Glucose, 1 hour 65 - 179 mg/dL 862  Glucose, 2 hour   Throat clearing 10/25/2023    Labs: OGTT fasting 71, 1 hour 168, 2 hour 176 on 12/14/2023 Lab Results  Component Value Date   HGBA1C 5.8 (H) 12/06/2023   Dietary and Lifestyle History: Patient is here today alone for initial assessment. Pt reports she is aiming to testing four times daily and feels this is too much. Pt reports she last tested December 4th and ran out of lancets while out of town and picked them up yesterday. Pt reports this morning she had a headache and did not test blood sugar. 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  Pt reports she has a hard time eating stating 3 meals daily. Pt reports previous GDM with her first pregnancy managed with diet and exercise. Patient does the shopping and cooking for her family. Pt reports eating out twice monthly. Pt reports weather as a barrier to physical activity. Pt reports making the following changes including avoid intake of chocolate and sugary sweetened beverages.All Pt's questions were answered  during this encounter.    Physical Activity: walk at work, ADL's Stress: 3 out of 10 / self care includes: talk, prayer Sleep: good    24 hr Recall:  First Meal:  1/2 baguette, cheese, coffee with milk or 1.5 cups of fruit, water Snack:  none or crackers Second meal: ~2:40 pm 1/2 cup rice, tomatoes, lamb, onions, water Snack:  berries or grapes or oranges or none Third meal:  peas, rice, chicken or lamb or fish, salad with dressing Snack:  none Beverages:  coffee with milk, water   NUTRITION INTERVENTION  Nutrition education (E-1) on the following topics:   Initial Follow-up  [x]  []  Definition of Gestational Diabetes [x]  []  Why dietary management is important in controlling blood glucose Poorly controlled diabetes can lead to fetal macrosomia, shoulder dystocia and neurological injuries, stillbirth and neonatal complications including respiratory distress syndrome, hypoglycemia and prolonged NICU admission.  [x]  []  Effects each nutrient has on blood glucose levels [x]  []  Simple carbohydrates vs complex carbohydrates [x]  []  Fluid intake [x]  []  Creating a balanced meal plan [x]  []  Carbohydrate counting  [x]  []  When to check blood glucose levels [x]  []  Proper blood glucose monitoring techniques [x]  []  Effect of stress and stress reduction techniques  [x]  []  Exercise effect on blood glucose levels, appropriate exercise during pregnancy [x]  []  Importance of limiting caffeine and abstaining from alcohol and smoking [x]  []  Medications used for  blood sugar control during pregnancy [x]  []  Hypoglycemia and rule of 15 [x]  []  Postpartum self care  Accu chek -Patient has a meter prior to visit. Patient is instructed to begin testing pre breakfast and 2 hours after each meal.  CBG: 136 mg/dL, reported as 1 hour post prandial per Pt   Patient instructed to monitor glucose levels: QID FBS: 60 - <= 95 mg/dL; 2 hour: <= 879 mg/dL  Patient received handouts: Nutrition Diabetes and  Pregnancy Carbohydrate Counting List Blood glucose log Snack ideas for diabetes during pregnancy Plate planner  Patient will be seen for follow-up as needed.    [1]  Current Outpatient Medications:    Accu-Chek Softclix Lancets lancets, Use as instructed; check blood glucose 4 times daily, Disp: 100 each, Rfl: 11   aspirin  EC 81 MG tablet, Take 1 tablet (81 mg total) by mouth at bedtime. Start taking when you are [redacted] weeks pregnant for rest of pregnancy for prevention of preeclampsia, Disp: 300 tablet, Rfl: 2   Blood Glucose Monitoring Suppl (ACCU-CHEK GUIDE) w/Device KIT, 1 kit by Does not apply route in the morning, at noon, in the evening, and at bedtime., Disp: 1 kit, Rfl: 0   Ferrous Sulfate (IRON PO), Take 1 tablet by mouth daily., Disp: , Rfl:    glucose blood (ACCU-CHEK GUIDE TEST) test strip, Use as instructed, Disp: 100 each, Rfl: 12   Prenatal Vit-Fe Fumarate-FA (PRENATAL PLUS VITAMIN/MINERAL) 27-1 MG TABS, Take 1 tablet by mouth daily., Disp: 30 tablet, Rfl: 11   promethazine  (PHENERGAN ) 25 MG tablet, Take 1 tablet (25 mg total) by mouth every 6 (six) hours as needed for nausea or vomiting., Disp: 30 tablet, Rfl: 1   acetaminophen -codeine  (TYLENOL  #3) 300-30 MG tablet, Take 1-2 tablets by mouth every 6 (six) hours as needed for moderate pain. (Patient not taking: Reported on 12/02/2023), Disp: 15 tablet, Rfl: 0   promethazine  (PHENERGAN ) 12.5 MG tablet, Take 1 tablet (12.5 mg total) by mouth every 6 (six) hours as needed for nausea or vomiting. (Patient not taking: Reported on 10/25/2023), Disp: 30 tablet, Rfl: 0

## 2023-12-30 ENCOUNTER — Ambulatory Visit (INDEPENDENT_AMBULATORY_CARE_PROVIDER_SITE_OTHER): Admitting: Dietician

## 2023-12-30 ENCOUNTER — Other Ambulatory Visit: Payer: Self-pay

## 2023-12-30 ENCOUNTER — Encounter: Attending: Family Medicine | Admitting: Dietician

## 2023-12-30 DIAGNOSIS — O24419 Gestational diabetes mellitus in pregnancy, unspecified control: Secondary | ICD-10-CM

## 2023-12-30 DIAGNOSIS — Z713 Dietary counseling and surveillance: Secondary | ICD-10-CM | POA: Insufficient documentation

## 2023-12-30 DIAGNOSIS — Z3A15 15 weeks gestation of pregnancy: Secondary | ICD-10-CM

## 2024-01-02 ENCOUNTER — Inpatient Hospital Stay (HOSPITAL_COMMUNITY)
Admission: AD | Admit: 2024-01-02 | Discharge: 2024-01-02 | Disposition: A | Discharge status: Home / Self Care | Attending: Obstetrics & Gynecology | Admitting: Obstetrics & Gynecology

## 2024-01-02 ENCOUNTER — Encounter (HOSPITAL_COMMUNITY): Payer: Self-pay | Admitting: Obstetrics & Gynecology

## 2024-01-02 DIAGNOSIS — Z3A15 15 weeks gestation of pregnancy: Secondary | ICD-10-CM | POA: Diagnosis not present

## 2024-01-02 DIAGNOSIS — O24419 Gestational diabetes mellitus in pregnancy, unspecified control: Secondary | ICD-10-CM

## 2024-01-02 DIAGNOSIS — O219 Vomiting of pregnancy, unspecified: Secondary | ICD-10-CM | POA: Diagnosis not present

## 2024-01-02 DIAGNOSIS — O26892 Other specified pregnancy related conditions, second trimester: Secondary | ICD-10-CM | POA: Diagnosis not present

## 2024-01-02 DIAGNOSIS — R519 Headache, unspecified: Secondary | ICD-10-CM

## 2024-01-02 LAB — CBC WITH DIFFERENTIAL/PLATELET
Abs Immature Granulocytes: 0.01 K/uL (ref 0.00–0.07)
Basophils Absolute: 0 K/uL (ref 0.0–0.1)
Basophils Relative: 0 %
Eosinophils Absolute: 0.2 K/uL (ref 0.0–0.5)
Eosinophils Relative: 2 %
HCT: 37.3 % (ref 36.0–46.0)
Hemoglobin: 12.8 g/dL (ref 12.0–15.0)
Immature Granulocytes: 0 %
Lymphocytes Relative: 37 %
Lymphs Abs: 2.4 K/uL (ref 0.7–4.0)
MCH: 28.9 pg (ref 26.0–34.0)
MCHC: 34.3 g/dL (ref 30.0–36.0)
MCV: 84.2 fL (ref 80.0–100.0)
Monocytes Absolute: 0.8 K/uL (ref 0.1–1.0)
Monocytes Relative: 11 %
Neutro Abs: 3.2 K/uL (ref 1.7–7.7)
Neutrophils Relative %: 50 %
Platelets: 253 K/uL (ref 150–400)
RBC: 4.43 MIL/uL (ref 3.87–5.11)
RDW: 13.9 % (ref 11.5–15.5)
WBC: 6.6 K/uL (ref 4.0–10.5)
nRBC: 0 % (ref 0.0–0.2)

## 2024-01-02 LAB — URINALYSIS, ROUTINE W REFLEX MICROSCOPIC
Bilirubin Urine: NEGATIVE
Glucose, UA: NEGATIVE mg/dL
Hgb urine dipstick: NEGATIVE
Ketones, ur: NEGATIVE mg/dL
Leukocytes,Ua: NEGATIVE
Nitrite: NEGATIVE
Protein, ur: NEGATIVE mg/dL
Specific Gravity, Urine: 1.004 — ABNORMAL LOW (ref 1.005–1.030)
pH: 6 (ref 5.0–8.0)

## 2024-01-02 LAB — BASIC METABOLIC PANEL WITH GFR
Anion gap: 7 (ref 5–15)
BUN: 5 mg/dL — ABNORMAL LOW (ref 6–20)
CO2: 22 mmol/L (ref 22–32)
Calcium: 8.9 mg/dL (ref 8.9–10.3)
Chloride: 107 mmol/L (ref 98–111)
Creatinine, Ser: 0.56 mg/dL (ref 0.44–1.00)
GFR, Estimated: >60 mL/min (ref >60)
Glucose, Bld: 87 mg/dL (ref 70–99)
Potassium: 3.9 mmol/L (ref 3.5–5.1)
Sodium: 136 mmol/L (ref 135–145)

## 2024-01-02 MED ORDER — ACETAMINOPHEN-CAFFEINE 500-65 MG PO TABS: 2.0000 | ORAL_TABLET | Freq: Four times a day (QID) | ORAL | 0 refills | Status: AC | PRN

## 2024-01-02 MED ORDER — ACETAMINOPHEN 500 MG PO TABS: 1000.0000 mg | ORAL_TABLET | Freq: Once | ORAL | Status: DC

## 2024-01-02 MED ORDER — ONDANSETRON 4 MG PO TBDP: 4.0000 mg | ORAL_TABLET | Freq: Four times a day (QID) | ORAL | 0 refills | Status: AC | PRN

## 2024-01-02 MED ORDER — PROCHLORPERAZINE MALEATE 10 MG PO TABS: 10.0000 mg | ORAL_TABLET | Freq: Four times a day (QID) | ORAL | 3 refills | Status: AC | PRN

## 2024-01-02 MED ORDER — ONDANSETRON 4 MG PO TBDP
4.0000 mg | ORAL_TABLET | Freq: Once | ORAL | Status: AC
  Administered 2024-01-02: 4 mg via ORAL
  Filled 2024-01-02: qty 1

## 2024-01-02 MED ORDER — PROCHLORPERAZINE MALEATE 10 MG PO TABS
10.0000 mg | ORAL_TABLET | Freq: Once | ORAL | Status: AC
  Administered 2024-01-02: 10 mg via ORAL
  Filled 2024-01-02: qty 1

## 2024-01-02 MED ORDER — ACETAMINOPHEN-CAFFEINE 500-65 MG PO TABS
2.0000 | ORAL_TABLET | Freq: Once | ORAL | Status: AC
  Administered 2024-01-02: 2 via ORAL
  Filled 2024-01-02: qty 2

## 2024-01-02 NOTE — MAU Note (Signed)
 Katherine Holder is a 38 y.o. at [redacted]w[redacted]d here in MAU reporting: headache  for over a week. Has take tylenol  and helps but then it come back again. Today tylenol  has not helped. Feeling weak and dizzy today at times.   LMP:  Onset of complaint: 1 week Pain score: 6 Vitals:   01/02/24 1641  BP: 128/69  Pulse: 82  Resp: 18  Temp: 98.4 F (36.9 C)     FHT: 148  Lab orders placed from triage: u/a

## 2024-01-02 NOTE — MAU Provider Note (Signed)
 [facility] Attending MAU Note  Chief Complaint: Headache    Event Date/Time   First Provider Initiated Contact with Patient 01/02/24 1710      SUBJECTIVE Katherine Holder is a 38 y.o. [id number] at [redacted]w[redacted]d by LMP with recent diagnosis of GDM based on abnormal early GTT (history of GDM in previous pregnancies) who presents with headache for over a week.  Has taken Tylenol  that helps a little, but it comes back again.  Today, she took two tablets (650 mg) of Tylenol , this did not help.  Currently rates this 6/10, pain is diffuse around the frontotemporal area.  No visual symptoms, no sensation or movement deficits.  She does say she has not been eating and drinking much as usual due to nausea, Phenergan  helps but not a lot. She is able to tolerate a little food and drink.  Reports feeling weak and tired, also feels dizzy.  No recent stressful events.  Denies any bleeding, leaking of fluid, abdominal pain, abnormal vaginal discharge, fevers, chills, sweats, dysuria, other GI or GU symptoms or other general symptoms.  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 No pertinent surgical history.  Social History   Socioeconomic History   Marital status: Significant Other    Spouse name: Not on file   Number of children: 2   Years of education: Not on file   Highest education level: Not on file  Occupational History   Occupation: Runner, Broadcasting/film/video    Comment:  [employer]  Tobacco Use   Smoking status: Never   Smokeless tobacco: Never  Vaping Use   Vaping status: Never Used  Substance and Sexual Activity   Alcohol use: Never   Drug use: Never   Sexual activity: Not Currently  Other Topics Concern   Not on file  Social History Narrative   Not on file   Social Drivers of Health   Tobacco Use: Low Risk (01/02/2024)   Patient History    Smoking Tobacco Use: Never    Smokeless Tobacco Use: Never    Passive Exposure: Not on file  Financial Resource Strain: Not on File (07/06/2022)   Received from [employer]    Financial Resource Strain: 0  Food Insecurity: No Food Insecurity (12/30/2023)   Epic    Worried About Programme Researcher, Broadcasting/film/video in the Last Year: Never true    Ran Out of Food in the Last Year: Never true  Transportation Needs: No Transportation Needs (12/06/2023)   Epic    Lack of Transportation (Medical): No    Lack of Transportation (Non-Medical): No  Physical Activity: Not on File (07/06/2022)  Received from [facility]   Physical Activity    Physical Activity: 0  Stress: Not on File (07/06/2022)   Received from [facility]   Stress    Stress: 0  Social Connections: Not on File (09/23/2022)   Received from [facility]   Social Connections    Connectedness: 0  Intimate Partner Violence: Unknown (04/25/2021)   Received from [facility]   HITS    Physically Hurt: Not on file    Insult or Talk Down To: Not on file    Threaten Physical Harm: Not on file    Scream or Curse: Not on file  Depression (PHQ2-9): Low Risk (12/06/2023)   Depression (PHQ2-9)    PHQ-2 Score: 2  Alcohol Screen: Not on file  Housing: Not on file  Utilities: Not on file  Health Literacy: Not on file   Medications Ordered Prior to Encounter[1] Allergies[2]  ROS: Pertinent items in HPI  OBJECTIVE BP 106/68 (BP Location: Right Arm)   Pulse 83   Temp 98.4 F (36.9 C)   Resp (P) 16   Ht 5' 6 (1.676 m)   LMP 09/16/2023  (Exact Date)   SpO2 98%   BMI 36.24 kg/m  Patient Vitals for the past 24 hrs:  BP Temp Pulse Resp SpO2 Height  01/02/24 1830 -- -- -- (P) 16 -- --  01/02/24 1714 106/68 -- 83 -- -- --  01/02/24 1711 104/64 -- 85 -- -- --  01/02/24 1710 115/68 -- 76 -- 98 % --  01/02/24 1709 (!) 111/54 -- 76 18 -- --  01/02/24 1641 128/69 98.4 F (36.9 C) 82 18 -- 5' 6 (1.676 m)  Orthostatic vitals taken, all normal.  CONSTITUTIONAL: Well-developed, well-nourished female in no acute distress.  HENT:  Normocephalic, atraumatic, External right and left ear normal.  EYES: Conjunctivae and EOM are normal. Pupils are equal, round, and reactive to light. No scleral icterus.  NECK: Normal range of motion, supple, no masses.  Normal thyroid .  SKIN: Skin is warm and dry. No rash noted. Not diaphoretic. No erythema. No pallor. NEUROLGIC: Alert and oriented to person, place, and time. Normal reflexes, muscle tone coordination. No cranial nerve deficit noted. PSYCHIATRIC: Normal mood and affect. Normal behavior. Normal judgment and thought content. CARDIOVASCULAR: Normal heart rate noted RESPIRATORY: Effort and breath sounds normal, no problems with respiration noted. ABDOMEN: Soft, normal bowel sounds, no distention noted.  No tenderness, rebound or guarding.  PELVIC: Deferred MUSCULOSKELETAL: Normal range of motion. No tenderness.  No cyanosis, clubbing, or edema.  2+ distal pulses.  LAB RESULTS Results for orders placed or performed during the hospital encounter of 01/02/24 (from the past 48 hours)  CBC with Differential/Platelet     Status: None   Collection Time: 01/02/24  4:22 PM  Result Value Ref Range   WBC 6.6 4.0 - 10.5 K/uL   RBC 4.43 3.87 - 5.11 MIL/uL   Hemoglobin 12.8 12.0 - 15.0 g/dL   HCT 62.6 63.9 - 53.9 %   MCV 84.2 80.0 - 100.0 fL   MCH 28.9 26.0 - 34.0 pg   MCHC 34.3 30.0 - 36.0 g/dL   RDW 86.0 88.4 - 84.4 %   Platelets 253 150 - 400 K/uL   nRBC 0.0 0.0 - 0.2 %   Neutrophils  Relative % 50 %   Neutro Abs 3.2 1.7 - 7.7 K/uL   Lymphocytes Relative 37 %   Lymphs Abs 2.4 0.7 - 4.0 K/uL   Monocytes Relative 11 %   Monocytes Absolute  0.8 0.1 - 1.0 K/uL   Eosinophils Relative 2 %   Eosinophils Absolute 0.2 0.0 - 0.5 K/uL   Basophils Relative 0 %   Basophils Absolute 0.0 0.0 - 0.1 K/uL   Immature Granulocytes 0 %   Abs Immature Granulocytes 0.01 0.00 - 0.07 K/uL    Comment: Performed at [facility], [address], [place], [place] [address]  Basic metabolic panel     Status: Abnormal   Collection Time: 01/02/24  4:22 PM  Result Value Ref Range   Sodium 136 135 - 145 mmol/L   Potassium 3.9 3.5 - 5.1 mmol/L   Chloride 107 98 - 111 mmol/L   CO2 22 22 - 32 mmol/L   Glucose, Bld 87 70 - 99 mg/dL    Comment: Glucose reference range applies only to samples taken after fasting for at least 8 hours.   BUN 5 (L) 6 - 20 mg/dL   Creatinine, Ser 9.43 0.44 - 1.00 mg/dL   Calcium 8.9 8.9 - 89.6 mg/dL   GFR, Estimated >39 >39 mL/min    Comment: (NOTE) Calculated using the CKD-EPI Creatinine Equation (2021)    Anion gap 7 5 - 15    Comment: Performed at [facility], [address], [place], [place] [address]  Urinalysis, Routine w reflex microscopic -Urine, Clean Catch     Status: Abnormal   Collection Time: 01/02/24  6:42 PM  Result Value Ref Range   Color, Urine STRAW (A) YELLOW   APPearance CLEAR CLEAR   Specific Gravity, Urine 1.004 (L) 1.005 - 1.030   pH 6.0 5.0 - 8.0   Glucose, UA NEGATIVE NEGATIVE mg/dL   Hgb urine dipstick NEGATIVE NEGATIVE   Bilirubin Urine NEGATIVE NEGATIVE   Ketones, ur NEGATIVE NEGATIVE mg/dL   Protein, ur NEGATIVE NEGATIVE mg/dL   Nitrite NEGATIVE NEGATIVE   Leukocytes,Ua NEGATIVE NEGATIVE    Comment: Performed at [facility], [address] [place], [place] [address]    IMAGING US  MAINE Comp Less 14 Wks Result Date: 12/10/2023 ----------------------------------------------------------------------  OBSTETRICS  REPORT                       (Signed Final 12/10/2023 12:38 pm) ---------------------------------------------------------------------- Patient Info  ID #:       [id number]                          D.O.B.:  1985-02-17 (38 yrs)(F)  Name:       [name]                    Visit Date: 12/08/2023 05:05 pm ---------------------------------------------------------------------- Performed By  Attending:        Jerilynn Buddle MD       Ref. Address:     [address]                                                             [place], [place]  [address]  Performed By:     Scarlet Flesher         [facility]  Referred By:      [facility] ---------------------------------------------------------------------- Orders  #  Description                           Code        Ordered By  1  US  OB COMP LESS 14 WKS                76801.0     KELLY DAVIS ----------------------------------------------------------------------  #  Order #                     Accession #                Episode #  1  [id number]                   [id number]                 [id number] ---------------------------------------------------------------------- Indications  Weeks of gestation of pregnancy not            Z3A.00  specified  Encounter for uncertain dates                  Z36.87  Pregnancy with inconclusive fetal viability    O36.80X0 ---------------------------------------------------------------------- Fetal Evaluation  Num Of Fetuses:         1  Preg. Location:         Intrauterine  Gest. Sac:              Intrauterine  Yolk Sac:               Visualized  Fetal Pole:              Visualized  Fetal Heart Rate(bpm):  167  Cardiac Activity:       Observed  Comment:    FHT 167, EGA [redacted]w[redacted]d, EDC 06/22/24 ---------------------------------------------------------------------- Biometry  CRL:      52.9  mm     G. Age:  11w 6d                  EDD:   06/22/24 ---------------------------------------------------------------------- OB History  Gravidity:    5         Term:   2        Prem:   0        SAB:   2  TOP:          0       Ectopic:  0        Living: 2 ----------------------------------------------------------------------                 Jerilynn Buddle, MD Electronically Signed Final Report   12/10/2023 12:38 pm ----------------------------------------------------------------------    MAU COURSE/MDM Excedrin headache 2 tablets + Compazine  10 mg given  Encouraged here to drink a lot of water while here in MAU Serum glucose 87 on CMP; given food to eat after antiemetic administration  After these interventions, patient felt much better. Resolved nausea and headache.  ASSESSMENT 1. Headache in pregnancy, antepartum, second trimester   2. Gestational diabetes mellitus (GDM), antepartum, gestational diabetes method of control unspecified   3. Nausea and vomiting during pregnancy   4. [redacted] weeks gestation of pregnancy     PLAN Advised to eat adequately and drink plenty of fluids Excedrin tension and Compazine  prescribed for headache; Zofran  also prescribed as needed for nausea Continue GDM monitoring as instructed. Discharged to home in stable condition; follow up with scheduled office appointments.  Future Appointments  Date Time Provider Department Center  01/04/2024 10:55 AM Regino Camie DELENA EDDY [facility]  02/07/2024 10:00 AM [facility] PROVIDER 1 [facility]  02/07/2024 10:30 AM [facility] US5 [facility]     Allergies as of 01/02/2024   No Known Allergies      Medication List     STOP taking these medications    acetaminophen -codeine  300-30 MG tablet Commonly known  as: TYLENOL  #3       TAKE these medications    Accu-Chek Guide Test test strip Generic drug: glucose blood Use as instructed   Accu-Chek Guide w/Device Kit 1 kit by Does not apply route in the morning, at noon, in the evening, and at bedtime.   Accu-Chek Softclix Lancets lancets Use as instructed; check blood glucose 4 times daily   acetaminophen -caffeine  500-65 MG Tabs per tablet Commonly known as: EXCEDRIN TENSION HEADACHE Take 2 tablets by mouth every 6 (six) hours as needed.   aspirin  EC 81 MG tablet Take 1 tablet (81 mg total) by mouth at bedtime. Start taking when you are [redacted] weeks pregnant for rest of pregnancy for prevention of preeclampsia   IRON PO Take 1 tablet by mouth daily.   ondansetron  4 MG disintegrating tablet Commonly known as: ZOFRAN -ODT Take 1 tablet (4 mg total) by mouth every 6 (six) hours as needed for refractory nausea / vomiting.   Prenatal Plus Vitamin/Mineral 27-1 MG Tabs Take 1 tablet by mouth daily.   prochlorperazine  10 MG tablet Commonly known as: COMPAZINE  Take 1 tablet (10 mg total) by mouth every 6 (six) hours as needed for nausea or vomiting.   promethazine  25 MG tablet Commonly known as: PHENERGAN  Take 1 tablet (25 mg total) by mouth  every 6 (six) hours as needed for nausea or vomiting. What changed: Another medication with the same name was removed. Continue taking this medication, and follow the directions you see here.        Evaluation does not show pathology that would require ongoing emergent intervention or inpatient treatment. Patient is hemodynamically stable and mentating appropriately. Discussed findings and plan with patient, who agrees with care plan. All questions answered. Return precautions discussed and outpatient follow up recommendations given.  Kyliee Ortego A, MD 01/02/2024 7:17 PM     [1]  No current facility-administered medications on file prior to encounter.   Current Outpatient Medications on File  Prior to Encounter  Medication Sig Dispense Refill   Prenatal Vit-Fe Fumarate-FA (PRENATAL PLUS VITAMIN/MINERAL) 27-1 MG TABS Take 1 tablet by mouth daily. 30 tablet 11   promethazine  (PHENERGAN ) 25 MG tablet Take 1 tablet (25 mg total) by mouth every 6 (six) hours as needed for nausea or vomiting. 30 tablet 1   Accu-Chek Softclix Lancets lancets Use as instructed; check blood glucose 4 times daily 100 each 11   acetaminophen -codeine  (TYLENOL  #3) 300-30 MG tablet Take 1-2 tablets by mouth every 6 (six) hours as needed for moderate pain. (Patient not taking: Reported on 12/02/2023) 15 tablet 0   aspirin  EC 81 MG tablet Take 1 tablet (81 mg total) by mouth at bedtime. Start taking when you are [redacted] weeks pregnant for rest of pregnancy for prevention of preeclampsia 300 tablet 2   Blood Glucose Monitoring Suppl (ACCU-CHEK GUIDE) w/Device KIT 1 kit by Does not apply route in the morning, at noon, in the evening, and at bedtime. 1 kit 0   Ferrous Sulfate (IRON PO) Take 1 tablet by mouth daily.     glucose blood (ACCU-CHEK GUIDE TEST) test strip Use as instructed 100 each 12   promethazine  (PHENERGAN ) 12.5 MG tablet Take 1 tablet (12.5 mg total) by mouth every 6 (six) hours as needed for nausea or vomiting. (Patient not taking: Reported on 10/25/2023) 30 tablet 0  [2] No Known Allergies

## 2024-01-04 ENCOUNTER — Other Ambulatory Visit: Payer: Self-pay

## 2024-01-04 ENCOUNTER — Ambulatory Visit: Admitting: Certified Nurse Midwife

## 2024-01-04 VITALS — BP 134/76 | HR 80 | Wt 224.1 lb

## 2024-01-04 DIAGNOSIS — O219 Vomiting of pregnancy, unspecified: Secondary | ICD-10-CM | POA: Diagnosis not present

## 2024-01-04 DIAGNOSIS — Z3A15 15 weeks gestation of pregnancy: Secondary | ICD-10-CM | POA: Diagnosis not present

## 2024-01-04 DIAGNOSIS — Z3490 Encounter for supervision of normal pregnancy, unspecified, unspecified trimester: Secondary | ICD-10-CM

## 2024-01-04 DIAGNOSIS — O24419 Gestational diabetes mellitus in pregnancy, unspecified control: Secondary | ICD-10-CM

## 2024-01-04 DIAGNOSIS — R519 Headache, unspecified: Secondary | ICD-10-CM

## 2024-01-04 DIAGNOSIS — O26892 Other specified pregnancy related conditions, second trimester: Secondary | ICD-10-CM | POA: Diagnosis not present

## 2024-01-04 MED ORDER — METOCLOPRAMIDE HCL 10 MG PO TABS: 10.0000 mg | ORAL_TABLET | Freq: Three times a day (TID) | ORAL | 8 refills | Status: AC | PRN

## 2024-01-04 NOTE — Progress Notes (Signed)
 PRENATAL VISIT NOTE  Subjective:  Katherine Holder is a 38 y.o. H4E7977 at [redacted]w[redacted]d being seen today for ongoing prenatal care.  She is currently monitored for the following issues for this low-risk pregnancy and has Encounter for supervision of low-risk pregnancy, antepartum; Gestational diabetes mellitus, antepartum; and BMI 37.0-37.9, adult on their problem list.  Patient reports headache.   . Vag. Bleeding: None.   . Denies leaking of fluid.   The following portions of the patient's history were reviewed and updated as appropriate: allergies, current medications, past family history, past medical history, past social history, past surgical history and problem list.   Objective:   Vitals:   01/04/24 1104  BP: 134/76  Pulse: 80  Weight: 224 lb 1.6 oz (101.7 kg)    Fetal Status:  Fetal Heart Rate (bpm): 141        General: Alert, oriented and cooperative. Patient is in no acute distress.  Skin: Skin is warm and dry. No rash noted.   Cardiovascular: Normal heart rate noted  Respiratory: Normal respiratory effort, no problems with respiration noted  Abdomen: Soft, gravid, appropriate for gestational age.  Pain/Pressure: Absent     Pelvic: Cervical exam deferred        Extremities: Normal range of motion.     Mental Status: Normal mood and affect. Normal behavior. Normal judgment and thought content.      12/06/2023    4:12 PM 11/12/2021    1:53 PM 06/21/2020   10:51 AM  Depression screen PHQ 2/9  Decreased Interest 0 0 0  Down, Depressed, Hopeless 0 0 0  PHQ - 2 Score 0 0 0  Altered sleeping 0 0 0  Tired, decreased energy 1 0 0  Change in appetite 1 0 0  Feeling bad or failure about yourself  0 0 0  Trouble concentrating 0 0 0  Moving slowly or fidgety/restless 0 0 0  Suicidal thoughts 0 0 0  PHQ-9 Score 2 0  0      Data saved with a previous flowsheet row definition        12/06/2023    4:12 PM 11/12/2021    1:53 PM 06/21/2020   10:51 AM 05/06/2020    4:04 PM  GAD 7 :  Generalized Anxiety Score  Nervous, Anxious, on Edge 0 0 0 0  Control/stop worrying 0 0 0 0  Worry too much - different things 0 0 0 0  Trouble relaxing 0 0 0 0  Restless 0 0 0 0  Easily annoyed or irritable 0 0 0 0  Afraid - awful might happen 0 0 0 0  Total GAD 7 Score 0 0 0 0    Assessment and Plan:  Pregnancy: [id number] at [redacted]w[redacted]d 1. Encounter for supervision of low-risk pregnancy, antepartum (Primary) - Doing well, feeling regular and vigorous fetal movement   2. [redacted] weeks gestation of pregnancy - Routine PNC, anticipatory guidance.  - AFP today.   3. Gestational diabetes mellitus (GDM), antepartum, gestational diabetes method of control unspecified - Has seen DM educator.   4. Headache in pregnancy, antepartum, second trimester - Recommend Magnesium and B2 in pregnancy.  - Seen in MAU - Excedrin Tension recommended there.  - Add Reglan  10mg  q6h PRN for breakthrough HA pain.   5. Nausea and vomiting during pregnancy - Doing well.   Preterm labor symptoms and general obstetric precautions including but not limited to vaginal bleeding, contractions, leaking of fluid and fetal movement were reviewed in detail  with the patient. Please refer to After Visit Summary for other counseling recommendations.   Return in about 4 weeks (around 02/01/2024) for LOB.  Future Appointments  Date Time Provider Department Center  02/04/2024 11:15 AM Davis, Devon E, PA-C [facility]  02/07/2024 10:00 AM [facility] PROVIDER 1 [facility]  02/07/2024 10:30 AM [facility] US5 [facility]  03/02/2024 11:15 AM [facility]AL 2 [facility]  03/30/2024  8:20 AM [facility] LAB [facility]  03/30/2024  9:35 AM [facility]AL 2 [facility]  04/27/2024 10:15 AM [facility]GENERAL 2 [facility]  05/09/2024 10:55 AM [facility]GENERAL 2 [facility]  05/23/2024 11:15 AM [facility]GENERAL 2 [facility]  05/30/2024 11:15 AM [facility]GENERAL 2 [facility]  06/06/2024 10:55 AM [facility]GENERAL 2 [facility]  06/13/2024  9:15 AM [facility]-GENERAL 2 [facility]   06/20/2024 11:15 AM [facility]-GENERAL 2 [facility]  06/27/2024 11:15 AM [facility]AL 2 [facility]    Camie DELENA Rote, CNM

## 2024-01-04 NOTE — Patient Instructions (Addendum)
 For prevention of migraines in pregnancy: -Magnesium, 400mg  by mouth, at bedtime -Vitamin B2, 400mg  by mouth, with breakfast  For treatment of migraines in pregnancy: -take medication at the first sign of the pain of a headache, or the first sign of your aura -start with 1000mg  Tylenol  (do not exceed 4000mg  of Tylenol  in 24hrs), with or without Reglan  10mg  -if no relief after 1-2hours, can take Flexeril  10mg   Magnesium in pregnancy  Magnesium is a natural electrolyte we find in our foods and beverages. It is beneficial in pregnancy for multiple complaints.  It can help with headaches, rest, muscle aches/spasm, and bowel movements.  You may take Magnesium Glycinate or Magnesium Oxide (available at drug stores and online). We recommend starting with one tablet and working up to two. Take Magnesium nightly at bedtime to promote rest.

## 2024-01-05 ENCOUNTER — Encounter: Admitting: Family Medicine

## 2024-01-06 LAB — AFP, SERUM, OPEN SPINA BIFIDA
AFP MoM: 1.02
AFP Value: 25.1 ng/mL
Gest. Age on Collection Date: 15 wk
Maternal Age At EDD: 37.6 a
OSBR Risk 1 IN: 10000
Test Results:: NEGATIVE
Weight: 224 [lb_av]

## 2024-02-03 ENCOUNTER — Encounter: Payer: Self-pay | Admitting: Obstetrics and Gynecology

## 2024-02-04 ENCOUNTER — Encounter: Admitting: Medical

## 2024-02-04 DIAGNOSIS — O09299 Supervision of pregnancy with other poor reproductive or obstetric history, unspecified trimester: Secondary | ICD-10-CM | POA: Insufficient documentation

## 2024-02-04 DIAGNOSIS — O99210 Obesity complicating pregnancy, unspecified trimester: Secondary | ICD-10-CM | POA: Insufficient documentation

## 2024-02-05 ENCOUNTER — Telehealth: Admitting: Nurse Practitioner

## 2024-02-05 DIAGNOSIS — J069 Acute upper respiratory infection, unspecified: Secondary | ICD-10-CM

## 2024-02-05 NOTE — Progress Notes (Signed)
 " Virtual Visit Consent   Katherine Holder, you are scheduled for a virtual visit with a [facility] provider today. Just as with appointments in the office, your consent must be obtained to participate. Your consent will be active for this visit and any virtual visit you may have with one of our providers in the next 365 days. If you have a MyChart account, a copy of this consent can be sent to you electronically.  As this is a virtual visit, video technology does not allow for your provider to perform a traditional examination. This may limit your provider's ability to fully assess your condition. If your provider identifies any concerns that need to be evaluated in person or the need to arrange testing (such as labs, EKG, etc.), we will make arrangements to do so. Although advances in technology are sophisticated, we cannot ensure that it will always work on either your end or our end. If the connection with a video visit is poor, the visit may have to be switched to a telephone visit. With either a video or telephone visit, we are not always able to ensure that we have a secure connection.  By engaging in this virtual visit, you consent to the provision of healthcare and authorize for your insurance to be billed (if applicable) for the services provided during this visit. Depending on your insurance coverage, you may receive a charge related to this service.  I need to obtain your verbal consent now. Are you willing to proceed with your visit today? Katherine Holder has provided verbal consent on 02/05/2024 for a virtual visit (video or telephone). [name], NP  Date: 02/05/2024 6:04 PM   Virtual Visit via Video Note   I, [name], connected with  Katherine Holder  ([id number], 09/19/1985) on 02/05/24 at  6:00 PM EST by a video-enabled telemedicine application and verified that I am speaking with the correct person using two identifiers.  Location: Patient: Virtual Visit Location Patient:  Home Provider: Virtual Visit Location Provider: Home Office   I discussed the limitations of evaluation and management by telemedicine and the availability of in person appointments. The patient expressed understanding and agreed to proceed.    History of Present Illness: Katherine Holder is a 39 y.o. who identifies as a female who was assigned female at birth, and is being seen today for URI with congestion.  Over the past 24 hours Ms Parrillo has been experiencing coughing, sweating, chills, fever and congestion. She has not taken an over the counter COVID/FLU test. Currently taking tussin and tylenol  for her symptoms. She is [redacted] weeks pregnant at this time.  Problems:  Patient Active Problem List   Diagnosis Date Noted   History of gestational diabetes mellitus (GDM) in prior pregnancy, currently pregnant 02/04/2024   Obesity affecting pregnancy, antepartum 02/04/2024   BMI 37.0-37.9, adult 12/08/2023   Encounter for supervision of low-risk pregnancy, antepartum 12/02/2023   Gestational diabetes mellitus, antepartum 12/02/2023    Allergies: Allergies[1] Medications: Current Medications[2]  Observations/Objective: Patient is well-developed, well-nourished in no acute distress.  Resting comfortably at home.  Head is normocephalic, atraumatic.  No labored breathing.  Speech is clear and coherent with logical content.  Patient is alert and oriented at baseline.    Assessment and Plan: 1. Viral URI with cough (Primary) Recommend taking an OTC COVID/FLU test.  Until then continue current Tussin and tylenol  If symptoms worsen and include chest pain or shortness of breath will need to be seen I  UC or ED  Follow Up Instructions: I discussed the assessment and treatment plan with the patient. The patient was provided an opportunity to ask questions and all were answered. The patient agreed with the plan and demonstrated an understanding of the instructions.  A copy of instructions were sent to  the patient via MyChart unless otherwise noted below.    The patient was advised to call back or seek an in-person evaluation if the symptoms worsen or if the condition fails to improve as anticipated.    [name], NP     [1] No Known Allergies [2]  Current Outpatient Medications:    Accu-Chek Softclix Lancets lancets, Use as instructed; check blood glucose 4 times daily, Disp: 100 each, Rfl: 11   acetaminophen -caffeine  (EXCEDRIN TENSION HEADACHE) 500-65 MG TABS per tablet, Take 2 tablets by mouth every 6 (six) hours as needed., Disp: 60 tablet, Rfl: 0   aspirin  EC 81 MG tablet, Take 1 tablet (81 mg total) by mouth at bedtime. Start taking when you are [redacted] weeks pregnant for rest of pregnancy for prevention of preeclampsia, Disp: 300 tablet, Rfl: 2   Blood Glucose Monitoring Suppl (ACCU-CHEK GUIDE) w/Device KIT, 1 kit by Does not apply route in the morning, at noon, in the evening, and at bedtime., Disp: 1 kit, Rfl: 0   Ferrous Sulfate (IRON PO), Take 1 tablet by mouth daily. (Patient not taking: Reported on 01/04/2024), Disp: , Rfl:    glucose blood (ACCU-CHEK GUIDE TEST) test strip, Use as instructed, Disp: 100 each, Rfl: 12   metoCLOPramide  (REGLAN ) 10 MG tablet, Take 1 tablet (10 mg total) by mouth every 8 (eight) hours as needed for nausea., Disp: 30 tablet, Rfl: 8   ondansetron  (ZOFRAN -ODT) 4 MG disintegrating tablet, Take 1 tablet (4 mg total) by mouth every 6 (six) hours as needed for refractory nausea / vomiting., Disp: 20 tablet, Rfl: 0   Prenatal Vit-Fe Fumarate-FA (PRENATAL PLUS VITAMIN/MINERAL) 27-1 MG TABS, Take 1 tablet by mouth daily., Disp: 30 tablet, Rfl: 11   prochlorperazine  (COMPAZINE ) 10 MG tablet, Take 1 tablet (10 mg total) by mouth every 6 (six) hours as needed for nausea or vomiting., Disp: 30 tablet, Rfl: 3   promethazine  (PHENERGAN ) 25 MG tablet, Take 1 tablet (25 mg total) by mouth every 6 (six) hours as needed for nausea or vomiting., Disp: 30 tablet, Rfl:  1  "

## 2024-02-05 NOTE — Patient Instructions (Signed)
 " [name], thank you for joining [name], NP for today's virtual visit.  While this provider is not your primary care provider (PCP), if your PCP is located in our provider database this encounter information will be shared with them immediately following your visit.   A [facility] MyChart account gives you access to today's visit and all your visits, tests, and labs performed at [facility]  click here if you don't have a [facility] MyChart account or go to [email or web address]  Consent: (Patient) [name] provided verbal consent for this virtual visit at the beginning of the encounter.  Current Medications:  Current Outpatient Medications:    Accu-Chek Softclix Lancets lancets, Use as instructed; check blood glucose 4 times daily, Disp: 100 each, Rfl: 11   acetaminophen -caffeine  (EXCEDRIN TENSION HEADACHE) 500-65 MG TABS per tablet, Take 2 tablets by mouth every 6 (six) hours as needed., Disp: 60 tablet, Rfl: 0   aspirin  EC 81 MG tablet, Take 1 tablet (81 mg total) by mouth at bedtime. Start taking when you are [redacted] weeks pregnant for rest of pregnancy for prevention of preeclampsia, Disp: 300 tablet, Rfl: 2   Blood Glucose Monitoring Suppl (ACCU-CHEK GUIDE) w/Device KIT, 1 kit by Does not apply route in the morning, at noon, in the evening, and at bedtime., Disp: 1 kit, Rfl: 0   Ferrous Sulfate (IRON PO), Take 1 tablet by mouth daily. (Patient not taking: Reported on 01/04/2024), Disp: , Rfl:    glucose blood (ACCU-CHEK GUIDE TEST) test strip, Use as instructed, Disp: 100 each, Rfl: 12   metoCLOPramide  (REGLAN ) 10 MG tablet, Take 1 tablet (10 mg total) by mouth every 8 (eight) hours as needed for nausea., Disp: 30 tablet, Rfl: 8   ondansetron  (ZOFRAN -ODT) 4 MG disintegrating tablet, Take 1 tablet (4 mg total) by mouth every 6 (six) hours as needed for refractory nausea / vomiting., Disp: 20 tablet, Rfl: 0   Prenatal Vit-Fe Fumarate-FA (PRENATAL PLUS  VITAMIN/MINERAL) 27-1 MG TABS, Take 1 tablet by mouth daily., Disp: 30 tablet, Rfl: 11   prochlorperazine  (COMPAZINE ) 10 MG tablet, Take 1 tablet (10 mg total) by mouth every 6 (six) hours as needed for nausea or vomiting., Disp: 30 tablet, Rfl: 3   promethazine  (PHENERGAN ) 25 MG tablet, Take 1 tablet (25 mg total) by mouth every 6 (six) hours as needed for nausea or vomiting., Disp: 30 tablet, Rfl: 1   Medications ordered in this encounter:  No orders of the defined types were placed in this encounter.    *If you need refills on other medications prior to your next appointment, please contact your pharmacy*  Follow-Up: Call back or seek an in-person evaluation if the symptoms worsen or if the condition fails to improve as anticipated.  [facility] [phone]  Other Instructions Recommend taking an OTC COVID/FLU test.  Until then continue current Tussin and tylenol  If symptoms worsen and include chest pain or shortness of breath will need to be seen I UC or ED   If you have been instructed to have an in-person evaluation today at a local Urgent Care facility, please use the link below. It will take you to a list of all of our available [facility], including address, phone number and hours of operation. Please do not delay care.   Urgent Cares  If you or a family member do not have a primary care provider, use the link below to schedule a visit and establish care.  When you choose a [facility] primary care physician or advanced practice provider, you gain a long-term partner in health. Find a Primary Care Provider  Learn more about [facility] in-office and virtual care options: [facility] Get Care Now  "

## 2024-02-07 ENCOUNTER — Encounter: Payer: Self-pay | Admitting: Obstetrics and Gynecology

## 2024-02-07 ENCOUNTER — Ambulatory Visit: Admitting: Obstetrics and Gynecology

## 2024-02-07 ENCOUNTER — Ambulatory Visit (HOSPITAL_BASED_OUTPATIENT_CLINIC_OR_DEPARTMENT_OTHER)

## 2024-02-07 ENCOUNTER — Other Ambulatory Visit

## 2024-02-07 ENCOUNTER — Ambulatory Visit

## 2024-02-07 ENCOUNTER — Other Ambulatory Visit: Payer: Self-pay

## 2024-02-07 ENCOUNTER — Ambulatory Visit: Attending: Obstetrics and Gynecology | Admitting: Maternal & Fetal Medicine

## 2024-02-07 VITALS — BP 105/75 | HR 88 | Wt 218.2 lb

## 2024-02-07 VITALS — BP 101/65 | HR 89

## 2024-02-07 DIAGNOSIS — O09522 Supervision of elderly multigravida, second trimester: Secondary | ICD-10-CM | POA: Insufficient documentation

## 2024-02-07 DIAGNOSIS — Z8632 Personal history of gestational diabetes: Secondary | ICD-10-CM

## 2024-02-07 DIAGNOSIS — O09292 Supervision of pregnancy with other poor reproductive or obstetric history, second trimester: Secondary | ICD-10-CM | POA: Diagnosis not present

## 2024-02-07 DIAGNOSIS — R519 Headache, unspecified: Secondary | ICD-10-CM

## 2024-02-07 DIAGNOSIS — O24419 Gestational diabetes mellitus in pregnancy, unspecified control: Secondary | ICD-10-CM | POA: Diagnosis not present

## 2024-02-07 DIAGNOSIS — O99212 Obesity complicating pregnancy, second trimester: Secondary | ICD-10-CM

## 2024-02-07 DIAGNOSIS — E669 Obesity, unspecified: Secondary | ICD-10-CM

## 2024-02-07 DIAGNOSIS — O26892 Other specified pregnancy related conditions, second trimester: Secondary | ICD-10-CM

## 2024-02-07 DIAGNOSIS — O99210 Obesity complicating pregnancy, unspecified trimester: Secondary | ICD-10-CM

## 2024-02-07 DIAGNOSIS — Z363 Encounter for antenatal screening for malformations: Secondary | ICD-10-CM | POA: Diagnosis not present

## 2024-02-07 DIAGNOSIS — Z3490 Encounter for supervision of normal pregnancy, unspecified, unspecified trimester: Secondary | ICD-10-CM

## 2024-02-07 DIAGNOSIS — Z6837 Body mass index (BMI) 37.0-37.9, adult: Secondary | ICD-10-CM

## 2024-02-07 DIAGNOSIS — Z3A20 20 weeks gestation of pregnancy: Secondary | ICD-10-CM | POA: Diagnosis not present

## 2024-02-07 DIAGNOSIS — O09299 Supervision of pregnancy with other poor reproductive or obstetric history, unspecified trimester: Secondary | ICD-10-CM

## 2024-02-07 DIAGNOSIS — O24410 Gestational diabetes mellitus in pregnancy, diet controlled: Secondary | ICD-10-CM | POA: Diagnosis not present

## 2024-02-07 NOTE — Progress Notes (Signed)
 "  Patient information  Patient Name: Katherine Holder  Patient MRN:   [id number]  Referring practice: MFM Referring Provider: [facility])  Problem List   Patient Active Problem List   Diagnosis Date Noted   History of gestational diabetes mellitus (GDM) in prior pregnancy, currently pregnant 02/04/2024   Obesity affecting pregnancy, antepartum 02/04/2024   BMI 37.0-37.9, adult 12/08/2023   Encounter for supervision of low-risk pregnancy, antepartum 12/02/2023   Gestational diabetes mellitus, antepartum 12/02/2023    Maternal Fetal Medicine Consult Katherine Holder is a 39 y.o. [id number] at [redacted]w[redacted]d here for ultrasound and consultation. She has declined genetic screening. She has no acute concerns except for a URI that she is nearly over.   Today we focused on the following:   The patient presents today for an anatomy ultrasound. Her obstetric history is notable for gestational diabetes in her first pregnancy, followed by a subsequent pregnancy without complications. In the current pregnancy, she has early-onset gestational diabetes. Review of laboratory data shows a fasting glucose of 71 mg/dL with an abnormal 2-hour value of 176 mg/dL. Her hemoglobin A1C is 5.8. Based on these findings, a fetal echocardiogram is not indicated.  The estimated fetal weight today is at the 14th percentile overall. We discussed that this is at the lower end of the normal range and should be reassessed with a repeat growth ultrasound in four weeks.  We reviewed the importance of optimal glycemic control during pregnancy to reduce the risk of adverse maternal and fetal outcomes. The patient reports that her blood glucose values are generally well controlled, though fasting values are occasionally above 100 mg/dL. She plans to trial a high-protein bedtime snack to improve fasting glucose control. We also discussed that in the setting of obesity, regular exercise of approximately 30 minutes per day  and a moderate-carbohydrate diet are recommended.  The pregnancy is further complicated by advanced maternal age. The patient declined genetic screening. Based on maternal age alone, the estimated risk of aneuploidy is approximately 1 in 200. Ultrasound cannot reliably detect chromosomal abnormalities and that genetic testing would provide further risk stratification should she desire it.  She is compliant with low-dose aspirin  for preeclampsia risk reduction. Ongoing care will include serial growth ultrasounds beginning at [redacted] weeks gestation. The patient verbalizes understanding and agrees with the plan.  RECOMMENDATIONS -Continue diet and lifestyle management for gestational diabetes with focus on fasting glucose optimization -Consider high-protein bedtime snack to improve fasting values -If one or more glucose values per day are not at control then medication should be considered  -Encourage daily exercise of approximately 30 minutes and a moderate-carbohydrate diet -Repeat growth ultrasound in four weeks -Continue serial growth ultrasounds beginning at 28 weeks -Continue low-dose aspirin  for preeclampsia risk reduction -No fetal echocardiogram indicated at this time -Genetic testing remains available if the patient desires further risk stratification  45 minutes of time was spent reviewing the patient's chart including labs, imaging and documentation.  At least 50% of this time was spent with direct patient care discussing the diagnosis, management and prognosis of her care.  Review of Systems: A review of systems was performed and was negative except per HPI   Past Obstetrical History:  OB History  Gravida Para Term Preterm AB Living  5 2 2  0 2 2  SAB IAB Ectopic Multiple Live Births  2 0 0  2    # Outcome Date GA Lbr Len/2nd Weight Sex Type Anes PTL Lv  5 Current           4 SAB 10/16/21 [redacted]w[redacted]d         3 Term 05/15/20 [redacted]w[redacted]d 06:25 / 00:13 7 lb 14.5 oz (3.586 kg) F Vag-Spont EPI,  Local  LIV     Birth Comments: WNL  2 SAB 04/2018          1 Term 03/31/16 [redacted]w[redacted]d   F Vag-Spont None  LIV     Past Medical History:  Past Medical History:  Diagnosis Date   Depression    Dysphonia 08/13/2023   History of gestational diabetes    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 No pertinent surgical history.   Home Medications:   Medications Ordered Prior to Encounter[1]    Allergies:   Allergies[2]   Physical Exam:   Vitals:   02/07/24 1317  BP: 101/65  Pulse: 89   Sitting comfortably on the sonogram table Nonlabored breathing Normal rate and rhythm Abdomen is nontender  Thank you for the opportunity to be involved with this patient's care. Please let us  know if we can be of any further assistance.   Delora Smaller MFM, [place]   02/07/2024  2:06 PM      [1]  Current Outpatient Medications on File Prior to Visit  Medication Sig Dispense Refill   aspirin  EC 81 MG tablet Take 1 tablet (81 mg total) by mouth at bedtime. Start taking when you are [redacted] weeks pregnant for rest of pregnancy for prevention of preeclampsia 300 tablet 2   Prenatal Vit-Fe Fumarate-FA (PRENATAL PLUS VITAMIN/MINERAL) 27-1 MG TABS Take 1 tablet by mouth daily. 30 tablet 11   Accu-Chek Softclix Lancets lancets Use as instructed; check blood glucose 4 times daily 100 each 11   acetaminophen -caffeine  (EXCEDRIN TENSION HEADACHE) 500-65 MG TABS per tablet Take 2 tablets by mouth every 6 (six) hours as needed. 60 tablet 0   Blood Glucose Monitoring Suppl (ACCU-CHEK GUIDE) w/Device KIT 1 kit by Does not apply route in the morning, at noon, in the evening, and at bedtime. 1 kit 0   Ferrous Sulfate (IRON PO) Take 1 tablet by mouth daily. (Patient not taking: Reported on 02/07/2024)     glucose blood (ACCU-CHEK GUIDE TEST) test strip Use as instructed 100 each 12   metoCLOPramide  (REGLAN ) 10 MG tablet Take 1 tablet (10 mg total)  by mouth every 8 (eight) hours as needed for nausea. (Patient not taking: Reported on 02/07/2024) 30 tablet 8   ondansetron  (ZOFRAN -ODT) 4 MG disintegrating tablet Take 1 tablet (4 mg total) by mouth every 6 (six) hours as needed for refractory nausea / vomiting. (Patient not taking: Reported on 02/07/2024) 20 tablet 0   prochlorperazine  (COMPAZINE ) 10 MG tablet Take 1 tablet (10 mg total) by mouth every 6 (six) hours as needed for nausea or vomiting. (Patient not taking: Reported on 02/07/2024) 30 tablet 3   promethazine  (PHENERGAN ) 25 MG tablet Take 1 tablet (25 mg total) by mouth every 6 (six) hours as needed for nausea or vomiting. (Patient not taking: Reported on 02/07/2024) 30 tablet 1   No current facility-administered medications on file prior to visit.  [2] No Known Allergies  "

## 2024-02-07 NOTE — Progress Notes (Signed)
 "  PRENATAL VISIT NOTE  Subjective:  Katherine Holder is a 39 y.o. H4E7977 at [redacted]w[redacted]d being seen today for ongoing prenatal care.  She is currently monitored for the following issues for this high-risk pregnancy and has Encounter for supervision of low-risk pregnancy, antepartum; Gestational diabetes mellitus, antepartum; BMI 37.0-37.9, adult; History of gestational diabetes mellitus (GDM) in prior pregnancy, currently pregnant; and Obesity affecting pregnancy, antepartum on their problem list.  Patient reports getting over a cold and feeling better.  Contractions: Not present. Vag. Bleeding: None.  Movement: Present. Denies leaking of fluid.   The following portions of the patient's history were reviewed and updated as appropriate: allergies, current medications, past family history, past medical history, past social history, past surgical history and problem list.   Objective:   Vitals:   02/07/24 1129  BP: 105/75  Pulse: 88  Weight: 218 lb 3.2 oz (99 kg)    Fetal Status:  Fetal Heart Rate (bpm): 140   Movement: Present    General: Alert, oriented and cooperative. Patient is in no acute distress.  Skin: Skin is warm and dry. No rash noted.   Cardiovascular: Normal heart rate noted  Respiratory: Normal respiratory effort, no problems with respiration noted  Abdomen: Soft, gravid, appropriate for gestational age.  Pain/Pressure: Absent     Pelvic: Cervical exam deferred        Extremities: Normal range of motion.  Edema: None  Mental Status: Normal mood and affect. Normal behavior. Normal judgment and thought content.      12/06/2023    4:12 PM 11/12/2021    1:53 PM 06/21/2020   10:51 AM  Depression screen PHQ 2/9  Decreased Interest 0 0 0  Down, Depressed, Hopeless 0 0 0  PHQ - 2 Score 0 0 0  Altered sleeping 0 0 0  Tired, decreased energy 1 0 0  Change in appetite 1 0 0  Feeling bad or failure about yourself  0 0 0  Trouble concentrating 0 0 0  Moving slowly or fidgety/restless  0 0 0  Suicidal thoughts 0 0 0  PHQ-9 Score 2 0  0      Data saved with a previous flowsheet row definition        12/06/2023    4:12 PM 11/12/2021    1:53 PM 06/21/2020   10:51 AM 05/06/2020    4:04 PM  GAD 7 : Generalized Anxiety Score  Nervous, Anxious, on Edge 0 0 0 0  Control/stop worrying 0 0 0 0  Worry too much - different things 0 0 0 0  Trouble relaxing 0 0 0 0  Restless 0 0 0 0  Easily annoyed or irritable 0 0 0 0  Afraid - awful might happen 0 0 0 0  Total GAD 7 Score 0 0 0 0    Assessment and Plan:  Pregnancy: [id number] at [redacted]w[redacted]d  1. Encounter for supervision of low-risk pregnancy, antepartum (Primary) Has anatomy scan today Does not want tubal  2. BMI 37.0-37.9, adult  3. History of gestational diabetes mellitus (GDM) in prior pregnancy, currently pregnant -reviewed CBGs -1/3 of fasting CBGs are elevated, patient reports eating at 11 pm, most of PP are within range -rec starting nightly insulin for elevated fasting glucose, patient would prefer to work on nighttime eating to see if she can improve her fasting glucose and if no improvement, she is agreeable to starting insulin next visit  4. Obesity affecting pregnancy, antepartum, unspecified obesity type Cont baby aspirin   5. Gestational diabetes mellitus (GDM), antepartum, gestational diabetes method of control unspecified  6. [redacted] weeks gestation of pregnancy   Preterm labor symptoms and general obstetric precautions including but not limited to vaginal bleeding, contractions, leaking of fluid and fetal movement were reviewed in detail with the patient. Please refer to After Visit Summary for other counseling recommendations.   Return in about 3 weeks (around 02/28/2024) for high OB.  Future Appointments  Date Time Provider Department Center  03/01/2024  4:15 PM Delores Nidia CROME, FNP [facility]  03/06/2024  7:15 AM [facility] PROVIDER 1 [facility]  03/06/2024  7:30 AM [facility] US3 [facility]  03/30/2024   8:20 AM [facility] LAB [facility]  03/30/2024  9:35 AM [facility]GENERAL 2 [facility]  04/27/2024 10:15 AM [facility]AL 2 [facility]  05/09/2024 10:55 AM [facility]GENERAL 2 [facility]  05/23/2024 11:15 AM [facility]GENERAL 2 [facility]  05/30/2024 11:15 AM [facility]GENERAL 2 [facility]  06/06/2024 10:55 AM [facility]GENERAL 2 [facility]  06/13/2024  9:15 AM [facility]-GENERAL 2 [facility]  06/20/2024 11:15 AM [facility]-GENERAL 2 [facility]  06/27/2024 11:15 AM [facility]GENERAL 2 [facility]    [name], MD  "

## 2024-02-23 ENCOUNTER — Other Ambulatory Visit

## 2024-02-23 ENCOUNTER — Ambulatory Visit

## 2024-03-01 ENCOUNTER — Encounter: Admitting: Obstetrics and Gynecology

## 2024-03-02 ENCOUNTER — Encounter: Payer: Self-pay | Admitting: Family Medicine

## 2024-03-06 ENCOUNTER — Ambulatory Visit

## 2024-03-06 DIAGNOSIS — O24419 Gestational diabetes mellitus in pregnancy, unspecified control: Secondary | ICD-10-CM

## 2024-03-30 ENCOUNTER — Other Ambulatory Visit: Payer: Self-pay

## 2024-04-27 ENCOUNTER — Encounter: Payer: Self-pay | Admitting: Student
